# Patient Record
Sex: Female | Born: 1960 | Hispanic: Yes | Marital: Married | Smoking: Never smoker
Health system: Southern US, Community
[De-identification: ages and names within clinical notes are randomized; demographics above are authoritative.]

## PROBLEM LIST (undated history)

## (undated) DIAGNOSIS — R3915 Urgency of urination: Secondary | ICD-10-CM

## (undated) DIAGNOSIS — L659 Nonscarring hair loss, unspecified: Secondary | ICD-10-CM

## (undated) DIAGNOSIS — Z789 Other specified health status: Secondary | ICD-10-CM

## (undated) DIAGNOSIS — E119 Type 2 diabetes mellitus without complications: Secondary | ICD-10-CM

## (undated) DIAGNOSIS — E785 Hyperlipidemia, unspecified: Secondary | ICD-10-CM

## (undated) DIAGNOSIS — E669 Obesity, unspecified: Secondary | ICD-10-CM

## (undated) DIAGNOSIS — N950 Postmenopausal bleeding: Secondary | ICD-10-CM

## (undated) DIAGNOSIS — K219 Gastro-esophageal reflux disease without esophagitis: Secondary | ICD-10-CM

## (undated) DIAGNOSIS — N9489 Other specified conditions associated with female genital organs and menstrual cycle: Secondary | ICD-10-CM

## (undated) HISTORY — DX: Other specified health status: Z78.9

## (undated) HISTORY — DX: Type 2 diabetes mellitus without complications: E11.9

## (undated) HISTORY — PX: COSMETIC SURGERY: SHX468

## (undated) HISTORY — PX: ABLATION: SHX5711

## (undated) HISTORY — PX: BREAST BIOPSY: SHX20

## (undated) HISTORY — PX: OTHER SURGICAL HISTORY: SHX169

---

## 2020-07-25 DIAGNOSIS — S0121XA Laceration without foreign body of nose, initial encounter: Secondary | ICD-10-CM | POA: Diagnosis not present

## 2020-10-07 ENCOUNTER — Ambulatory Visit: Payer: BC Managed Care – PPO | Admitting: Obstetrics and Gynecology

## 2020-10-07 NOTE — Progress Notes (Signed)
PCP: Pcp, No   Chief Complaint  Patient presents with   Gynecologic Exam    Hot flashes, feels eyes dry all the time    HPI:      [name] is a 60 y.o. No obstetric history on file. whose LMP was No LMP recorded. Patient has had an ablation., presents today for her NP annual examination.  Her menses are absent due to ablation and then menopause. No VB for several yrs. No dysmen, pain. She does have vasomotor sx, declines tx.    Sex activity: single partner, contraception - post menopausal status. She does have vaginal dryness, improved with lubricants.  Last Pap:   > 3 yrs ago, hx of abn pap in distant past with normal repeat; no bx/tx. Hx of STDs: none  Last mammogram: ~3 yrs ago in [place]. There is a FH of breast cancer in her mom, genetic testing not indicated. There is no FH of ovarian cancer. The patient does do self-breast exams.  Colonoscopy: never  Tobacco use: The patient denies current or previous tobacco use. Alcohol use: none No drug use Exercise: moderately active  She does get adequate calcium but not Vitamin D in her diet.  No recent labs, hx of borderline hyperlipidemia in past.   Complains of dry eyes for about 3 yrs. Ophthalmologist recommended OTC eye drops that have been helpful. Pt has noticed dry throat for past 6 months. Drinks plenty of water. Hx of allergies when lived in [place], but not in [place] or here so far (moved here 3/22). No labs done for sjogrens.    Past Medical History:  Diagnosis Date   No pertinent past medical history     Past Surgical History:  Procedure Laterality Date   ABLATION     OTHER SURGICAL HISTORY     Bladder lifted, tummy tuck    Family History  Problem Relation Age of Onset   Breast cancer Mother 53    Social History   Socioeconomic History   Marital status: Married    Spouse name: Not on file   Number of children: Not on file   Years of education: Not on file   Highest education level: Not on file   Occupational History   Not on file  Tobacco Use   Smoking status: Never   Smokeless tobacco: Never  Vaping Use   Vaping Use: Never used  Substance and Sexual Activity   Alcohol use: Not Currently   Drug use: Never   Sexual activity: Yes    Birth control/protection: Surgical    Comment: Ablation  Other Topics Concern   Not on file  Social History Narrative   Not on file   Social Determinants of Health   Financial Resource Strain: Not on file  Food Insecurity: Not on file  Transportation Needs: Not on file  Physical Activity: Not on file  Stress: Not on file  Social Connections: Not on file  Intimate Partner Violence: Not on file    No current outpatient medications on file.     ROS:  Review of Systems  Constitutional:  Negative for fatigue, fever and unexpected weight change.  Respiratory:  Negative for cough, shortness of breath and wheezing.   Cardiovascular:  Negative for chest pain, palpitations and leg swelling.  Gastrointestinal:  Negative for blood in stool, constipation, diarrhea, nausea and vomiting.  Endocrine: Negative for cold intolerance, heat intolerance and polyuria.  Genitourinary:  Positive for dyspareunia. Negative for dysuria, flank pain, frequency, genital  sores, hematuria, menstrual problem, pelvic pain, urgency, vaginal bleeding, vaginal discharge and vaginal pain.  Musculoskeletal:  Negative for back pain, joint swelling and myalgias.  Skin:  Negative for rash.  Neurological:  Positive for dizziness. Negative for syncope, light-headedness, numbness and headaches.  Hematological:  Negative for adenopathy.  Psychiatric/Behavioral:  Negative for agitation, confusion, sleep disturbance and suicidal ideas. The patient is not nervous/anxious.   BREAST: No symptoms    Objective: BP 124/70   Ht 5\' 2"  (1.575 m)   Wt 181 lb (82.1 kg)   BMI 33.11 kg/m    Physical Exam Constitutional:      Appearance: She is well-developed.  Genitourinary:      Vulva normal.     Right Labia: No rash, tenderness or lesions.    Left Labia: No tenderness, lesions or rash.    No vaginal discharge, erythema or tenderness.      Right Adnexa: not tender and no mass present.    Left Adnexa: not tender and no mass present.    No cervical friability or polyp.     Uterus is not enlarged or tender.  Breasts:    Right: No mass, nipple discharge, skin change or tenderness.     Left: No mass, nipple discharge, skin change or tenderness.  Neck:     Thyroid: No thyromegaly.  Cardiovascular:     Rate and Rhythm: Normal rate and regular rhythm.     Heart sounds: Normal heart sounds. No murmur heard. Pulmonary:     Effort: Pulmonary effort is normal.     Breath sounds: Normal breath sounds.  Abdominal:     Palpations: Abdomen is soft.     Tenderness: There is no abdominal tenderness. There is no guarding or rebound.  Musculoskeletal:        General: Normal range of motion.     Cervical back: Normal range of motion.  Lymphadenopathy:     Cervical: No cervical adenopathy.  Neurological:     General: No focal deficit present.     Mental Status: She is alert and oriented to person, place, and time.     Cranial Nerves: No cranial nerve deficit.  Skin:    General: Skin is warm and dry.  Psychiatric:        Mood and Affect: Mood normal.        Behavior: Behavior normal.        Thought Content: Thought content normal.        Judgment: Judgment normal.  Vitals reviewed.    Assessment/Plan:  Encounter for annual routine gynecological examination  Cervical cancer screening - Plan: Cytology - PAP  Screening for HPV (human papillomavirus) - Plan: Cytology - PAP  Encounter for screening mammogram for malignant neoplasm of breast - Plan: MM 3D SCREEN BREAST BILATERAL; pt to sched mammo  Screening for colon cancer - Plan: Cologuard; colonoscopy/cologuard discussed. Pt elects cologuard. Ref sent. Will f/u with results.  Dry eyes - Plan: Sjogrens  syndrome-A extractable nuclear antibody, Sjogrens syndrome-B extractable nuclear antibody, CBC with Differential/Platelet, TSH; check labs. If neg, cont to use OTC eye drops per opthalmology  Dry mouth - Plan: Sjogrens syndrome-A extractable nuclear antibody, Sjogrens syndrome-B extractable nuclear antibody, CBC with Differential/Platelet; check labs.   Blood tests for routine general physical examination - Plan: Comprehensive metabolic panel, Lipid panel, Sjogrens syndrome-A extractable nuclear antibody, Sjogrens syndrome-B extractable nuclear antibody, CBC with Differential/Platelet  BMI 33.0-33.9,adult - Plan: Comprehensive metabolic panel  Screening cholesterol level - Plan: Lipid panel  H/O elevated lipids - Plan: Lipid panel  Thyroid disorder screening - Plan: TSH          GYN counsel breast self exam, mammography screening, menopause, adequate intake of calcium and vitamin D, diet and exercise    F/U  Return in about 1 year (around 10/08/2021).  Rebacca Votaw B. Zane Pellecchia, PA-C 10/08/2020 1:41 PM

## 2020-10-07 NOTE — Patient Instructions (Addendum)
I value your feedback and you entrusting us with your care. If you get a [facility] patient survey, I would appreciate you taking the time to let us know about your experience today. Thank you!  [facility]: [phone]  [facility]: [phone]    

## 2020-10-08 ENCOUNTER — Ambulatory Visit (INDEPENDENT_AMBULATORY_CARE_PROVIDER_SITE_OTHER): Payer: BC Managed Care – PPO | Admitting: Obstetrics and Gynecology

## 2020-10-08 ENCOUNTER — Other Ambulatory Visit (HOSPITAL_COMMUNITY)
Admission: RE | Admit: 2020-10-08 | Discharge: 2020-10-08 | Disposition: A | Discharge status: Home / Self Care | Payer: BC Managed Care – PPO | Source: Ambulatory Visit | Attending: Obstetrics and Gynecology | Admitting: Obstetrics and Gynecology

## 2020-10-08 ENCOUNTER — Other Ambulatory Visit: Payer: Self-pay

## 2020-10-08 ENCOUNTER — Encounter: Payer: Self-pay | Admitting: Obstetrics and Gynecology

## 2020-10-08 VITALS — BP 124/70 | Ht 62.0 in | Wt 181.0 lb

## 2020-10-08 DIAGNOSIS — H04123 Dry eye syndrome of bilateral lacrimal glands: Secondary | ICD-10-CM | POA: Insufficient documentation

## 2020-10-08 DIAGNOSIS — Z13220 Encounter for screening for lipoid disorders: Secondary | ICD-10-CM

## 2020-10-08 DIAGNOSIS — Z124 Encounter for screening for malignant neoplasm of cervix: Secondary | ICD-10-CM

## 2020-10-08 DIAGNOSIS — R682 Dry mouth, unspecified: Secondary | ICD-10-CM

## 2020-10-08 DIAGNOSIS — Z01419 Encounter for gynecological examination (general) (routine) without abnormal findings: Secondary | ICD-10-CM

## 2020-10-08 DIAGNOSIS — Z1151 Encounter for screening for human papillomavirus (HPV): Secondary | ICD-10-CM | POA: Insufficient documentation

## 2020-10-08 DIAGNOSIS — L659 Nonscarring hair loss, unspecified: Secondary | ICD-10-CM | POA: Insufficient documentation

## 2020-10-08 DIAGNOSIS — Z0000 Encounter for general adult medical examination without abnormal findings: Secondary | ICD-10-CM

## 2020-10-08 DIAGNOSIS — Z1231 Encounter for screening mammogram for malignant neoplasm of breast: Secondary | ICD-10-CM | POA: Diagnosis not present

## 2020-10-08 DIAGNOSIS — Z6833 Body mass index (BMI) 33.0-33.9, adult: Secondary | ICD-10-CM

## 2020-10-08 DIAGNOSIS — Z1329 Encounter for screening for other suspected endocrine disorder: Secondary | ICD-10-CM

## 2020-10-08 DIAGNOSIS — Z8639 Personal history of other endocrine, nutritional and metabolic disease: Secondary | ICD-10-CM

## 2020-10-08 DIAGNOSIS — Z1211 Encounter for screening for malignant neoplasm of colon: Secondary | ICD-10-CM

## 2020-10-09 ENCOUNTER — Other Ambulatory Visit: Payer: BC Managed Care – PPO

## 2020-10-09 DIAGNOSIS — Z0000 Encounter for general adult medical examination without abnormal findings: Secondary | ICD-10-CM | POA: Diagnosis not present

## 2020-10-09 DIAGNOSIS — R682 Dry mouth, unspecified: Secondary | ICD-10-CM

## 2020-10-09 DIAGNOSIS — Z8639 Personal history of other endocrine, nutritional and metabolic disease: Secondary | ICD-10-CM

## 2020-10-09 DIAGNOSIS — H04123 Dry eye syndrome of bilateral lacrimal glands: Secondary | ICD-10-CM | POA: Diagnosis not present

## 2020-10-09 DIAGNOSIS — Z13220 Encounter for screening for lipoid disorders: Secondary | ICD-10-CM

## 2020-10-09 DIAGNOSIS — Z1329 Encounter for screening for other suspected endocrine disorder: Secondary | ICD-10-CM | POA: Diagnosis not present

## 2020-10-09 DIAGNOSIS — Z6833 Body mass index (BMI) 33.0-33.9, adult: Secondary | ICD-10-CM

## 2020-10-10 LAB — LIPID PANEL
Chol/HDL Ratio: 4.1 ratio (ref 0.0–4.4)
Cholesterol, Total: 195 mg/dL (ref 100–199)
HDL: 48 mg/dL (ref >39)
LDL Chol Calc (NIH): 126 mg/dL — ABNORMAL HIGH (ref 0–99)
Triglycerides: 119 mg/dL (ref 0–149)
VLDL Cholesterol Cal: 21 mg/dL (ref 5–40)

## 2020-10-10 LAB — CBC WITH DIFFERENTIAL/PLATELET
Basophils Absolute: 0.1 10*3/uL (ref 0.0–0.2)
Basos: 1 %
EOS (ABSOLUTE): 0.1 10*3/uL (ref 0.0–0.4)
Eos: 1 %
Hematocrit: 42.6 % (ref 34.0–46.6)
Hemoglobin: 14.1 g/dL (ref 11.1–15.9)
Immature Grans (Abs): 0 10*3/uL (ref 0.0–0.1)
Immature Granulocytes: 0 %
Lymphocytes Absolute: 2.4 10*3/uL (ref 0.7–3.1)
Lymphs: 48 %
MCH: 28.3 pg (ref 26.6–33.0)
MCHC: 33.1 g/dL (ref 31.5–35.7)
MCV: 85 fL (ref 79–97)
Monocytes Absolute: 0.4 10*3/uL (ref 0.1–0.9)
Monocytes: 8 %
Neutrophils Absolute: 2.1 10*3/uL (ref 1.4–7.0)
Neutrophils: 42 %
Platelets: 275 10*3/uL (ref 150–450)
RBC: 4.99 x10E6/uL (ref 3.77–5.28)
RDW: 12.6 % (ref 11.7–15.4)
WBC: 5.1 10*3/uL (ref 3.4–10.8)

## 2020-10-10 LAB — COMPREHENSIVE METABOLIC PANEL
ALT: 27 IU/L (ref 0–32)
AST: 18 IU/L (ref 0–40)
Albumin/Globulin Ratio: 1.5 (ref 1.2–2.2)
Albumin: 4 g/dL (ref 3.8–4.9)
Alkaline Phosphatase: 86 IU/L (ref 44–121)
BUN/Creatinine Ratio: 17 (ref 9–23)
BUN: 13 mg/dL (ref 6–24)
Bilirubin Total: 0.9 mg/dL (ref 0.0–1.2)
CO2: 22 mmol/L (ref 20–29)
Calcium: 9 mg/dL (ref 8.7–10.2)
Chloride: 104 mmol/L (ref 96–106)
Creatinine, Ser: 0.77 mg/dL (ref 0.57–1.00)
Globulin, Total: 2.6 g/dL (ref 1.5–4.5)
Glucose: 120 mg/dL — ABNORMAL HIGH (ref 65–99)
Potassium: 4.2 mmol/L (ref 3.5–5.2)
Sodium: 140 mmol/L (ref 134–144)
Total Protein: 6.6 g/dL (ref 6.0–8.5)
eGFR: 89 mL/min/{1.73_m2} (ref >59)

## 2020-10-10 LAB — SJOGRENS SYNDROME-B EXTRACTABLE NUCLEAR ANTIBODY: ENA SSB (LA) Ab: <0.2 AI (ref 0.0–0.9)

## 2020-10-10 LAB — SJOGRENS SYNDROME-A EXTRACTABLE NUCLEAR ANTIBODY: ENA SSA (RO) Ab: <0.2 AI (ref 0.0–0.9)

## 2020-10-10 LAB — TSH: TSH: 1.76 u[IU]/mL (ref 0.450–4.500)

## 2020-10-13 LAB — CYTOLOGY - PAP
Comment: NEGATIVE
Diagnosis: NEGATIVE
Diagnosis: REACTIVE
High risk HPV: NEGATIVE

## 2020-10-15 LAB — SPECIMEN STATUS REPORT

## 2020-10-15 LAB — HGB A1C W/O EAG: Hgb A1c MFr Bld: 6.5 % — ABNORMAL HIGH (ref 4.8–5.6)

## 2020-10-22 ENCOUNTER — Encounter: Payer: Self-pay | Admitting: Obstetrics and Gynecology

## 2020-10-22 DIAGNOSIS — E119 Type 2 diabetes mellitus without complications: Secondary | ICD-10-CM

## 2020-10-22 MED ORDER — METFORMIN HCL 500 MG PO TABS: 500.0000 mg | ORAL_TABLET | Freq: Every day | ORAL | 0 refills | Status: DC

## 2020-10-29 ENCOUNTER — Ambulatory Visit
Admission: RE | Admit: 2020-10-29 | Discharge: 2020-10-29 | Disposition: A | Discharge status: Home / Self Care | Payer: BC Managed Care – PPO | Source: Ambulatory Visit | Attending: Obstetrics and Gynecology | Admitting: Obstetrics and Gynecology

## 2020-10-29 ENCOUNTER — Other Ambulatory Visit: Payer: Self-pay

## 2020-10-29 DIAGNOSIS — Z1231 Encounter for screening mammogram for malignant neoplasm of breast: Secondary | ICD-10-CM | POA: Diagnosis not present

## 2020-11-17 ENCOUNTER — Other Ambulatory Visit: Payer: Self-pay | Admitting: Obstetrics and Gynecology

## 2020-11-17 DIAGNOSIS — R928 Other abnormal and inconclusive findings on diagnostic imaging of breast: Secondary | ICD-10-CM

## 2020-11-17 DIAGNOSIS — N6320 Unspecified lump in the left breast, unspecified quadrant: Secondary | ICD-10-CM

## 2020-11-18 ENCOUNTER — Other Ambulatory Visit: Payer: Self-pay | Admitting: Internal Medicine

## 2020-11-23 ENCOUNTER — Ambulatory Visit
Admission: RE | Admit: 2020-11-23 | Discharge: 2020-11-23 | Disposition: A | Discharge status: Home / Self Care | Payer: BC Managed Care – PPO | Source: Ambulatory Visit | Attending: Obstetrics and Gynecology | Admitting: Obstetrics and Gynecology

## 2020-11-23 ENCOUNTER — Other Ambulatory Visit: Payer: Self-pay

## 2020-11-23 DIAGNOSIS — R928 Other abnormal and inconclusive findings on diagnostic imaging of breast: Secondary | ICD-10-CM

## 2020-11-23 DIAGNOSIS — N6320 Unspecified lump in the left breast, unspecified quadrant: Secondary | ICD-10-CM

## 2020-11-23 DIAGNOSIS — R922 Inconclusive mammogram: Secondary | ICD-10-CM | POA: Diagnosis not present

## 2020-11-23 DIAGNOSIS — N6002 Solitary cyst of left breast: Secondary | ICD-10-CM | POA: Diagnosis not present

## 2020-12-11 DIAGNOSIS — Z20822 Contact with and (suspected) exposure to covid-19: Secondary | ICD-10-CM | POA: Diagnosis not present

## 2020-12-14 ENCOUNTER — Telehealth: Payer: Self-pay

## 2020-12-14 NOTE — Telephone Encounter (Signed)
Called pt to f/u on Cologuard test, is she still planning to complete. Pt says she's been thinking about it, hasn't had the courage.

## 2021-03-14 DIAGNOSIS — J019 Acute sinusitis, unspecified: Secondary | ICD-10-CM | POA: Diagnosis not present

## 2021-03-14 DIAGNOSIS — H6593 Unspecified nonsuppurative otitis media, bilateral: Secondary | ICD-10-CM | POA: Diagnosis not present

## 2021-06-02 DIAGNOSIS — Z1211 Encounter for screening for malignant neoplasm of colon: Secondary | ICD-10-CM

## 2021-06-02 HISTORY — DX: Encounter for screening for malignant neoplasm of colon: Z12.11

## 2021-06-11 DIAGNOSIS — Z1211 Encounter for screening for malignant neoplasm of colon: Secondary | ICD-10-CM | POA: Diagnosis not present

## 2021-06-18 LAB — COLOGUARD: COLOGUARD: NEGATIVE

## 2021-06-21 ENCOUNTER — Encounter: Payer: Self-pay | Admitting: Obstetrics and Gynecology

## 2021-06-21 ENCOUNTER — Telehealth: Payer: Self-pay | Admitting: Obstetrics and Gynecology

## 2021-06-21 NOTE — Telephone Encounter (Signed)
Pt aware of neg Cologuard results, repeat in 3 yrs.

## 2021-06-29 ENCOUNTER — Ambulatory Visit
Admission: RE | Admit: 2021-06-29 | Discharge: 2021-06-29 | Disposition: A | Discharge status: Home / Self Care | Payer: BC Managed Care – PPO | Source: Ambulatory Visit | Attending: Obstetrics and Gynecology | Admitting: Obstetrics and Gynecology

## 2021-06-29 ENCOUNTER — Encounter: Payer: Self-pay | Admitting: Obstetrics and Gynecology

## 2021-06-29 ENCOUNTER — Other Ambulatory Visit: Payer: Self-pay

## 2021-06-29 DIAGNOSIS — N6320 Unspecified lump in the left breast, unspecified quadrant: Secondary | ICD-10-CM | POA: Insufficient documentation

## 2021-06-29 DIAGNOSIS — R928 Other abnormal and inconclusive findings on diagnostic imaging of breast: Secondary | ICD-10-CM | POA: Insufficient documentation

## 2021-06-29 DIAGNOSIS — R922 Inconclusive mammogram: Secondary | ICD-10-CM | POA: Diagnosis not present

## 2021-07-14 LAB — COLOGUARD: Cologuard: NEGATIVE

## 2021-07-22 ENCOUNTER — Ambulatory Visit: Payer: BC Managed Care – PPO | Admitting: Adult Health

## 2021-08-11 ENCOUNTER — Ambulatory Visit: Payer: BC Managed Care – PPO | Admitting: Internal Medicine

## 2021-12-13 ENCOUNTER — Encounter: Payer: Self-pay | Admitting: Nurse Practitioner

## 2021-12-13 ENCOUNTER — Ambulatory Visit: Payer: BC Managed Care – PPO | Admitting: Nurse Practitioner

## 2021-12-13 ENCOUNTER — Other Ambulatory Visit: Payer: Self-pay

## 2021-12-13 VITALS — BP 124/72 | HR 96 | Temp 98.0°F | Resp 18 | Ht 62.0 in | Wt 183.3 lb

## 2021-12-13 DIAGNOSIS — Z114 Encounter for screening for human immunodeficiency virus [HIV]: Secondary | ICD-10-CM

## 2021-12-13 DIAGNOSIS — Z7689 Persons encountering health services in other specified circumstances: Secondary | ICD-10-CM

## 2021-12-13 DIAGNOSIS — L659 Nonscarring hair loss, unspecified: Secondary | ICD-10-CM

## 2021-12-13 DIAGNOSIS — E1165 Type 2 diabetes mellitus with hyperglycemia: Secondary | ICD-10-CM | POA: Diagnosis not present

## 2021-12-13 DIAGNOSIS — G8929 Other chronic pain: Secondary | ICD-10-CM

## 2021-12-13 DIAGNOSIS — Z1231 Encounter for screening mammogram for malignant neoplasm of breast: Secondary | ICD-10-CM

## 2021-12-13 DIAGNOSIS — E669 Obesity, unspecified: Secondary | ICD-10-CM | POA: Insufficient documentation

## 2021-12-13 DIAGNOSIS — Z1159 Encounter for screening for other viral diseases: Secondary | ICD-10-CM | POA: Diagnosis not present

## 2021-12-13 DIAGNOSIS — E785 Hyperlipidemia, unspecified: Secondary | ICD-10-CM | POA: Diagnosis not present

## 2021-12-13 DIAGNOSIS — Z130 Encounter for screening for diseases of the blood and blood-forming organs and certain disorders involving the immune mechanism: Secondary | ICD-10-CM

## 2021-12-13 DIAGNOSIS — Z113 Encounter for screening for infections with a predominantly sexual mode of transmission: Secondary | ICD-10-CM | POA: Diagnosis not present

## 2021-12-13 DIAGNOSIS — H04123 Dry eye syndrome of bilateral lacrimal glands: Secondary | ICD-10-CM

## 2021-12-13 DIAGNOSIS — R1012 Left upper quadrant pain: Secondary | ICD-10-CM

## 2021-12-13 DIAGNOSIS — R682 Dry mouth, unspecified: Secondary | ICD-10-CM

## 2021-12-13 NOTE — Assessment & Plan Note (Signed)
Getting labs.  Patient reports she has a dry mouth even with drinking water.

## 2021-12-13 NOTE — Assessment & Plan Note (Signed)
Last LDL was 126.  Discussed decreasing foods high in saturated fats.  We will get labs today.

## 2021-12-13 NOTE — Assessment & Plan Note (Signed)
Patient not currently on any medication.  Her last A1c was 6.5 on 10/09/2020.  Patient does report polydipsia.  We will get labs today.

## 2021-12-13 NOTE — Progress Notes (Signed)
BP 124/72   Pulse 96   Temp 98 F (36.7 C) (Oral)   Resp 18   Ht 5\' 2"  (1.575 m)   Wt 183 lb 4.8 oz (83.1 kg)   SpO2 99%   BMI 33.53 kg/m    Subjective:    Patient ID: , female    DOB: [birth year], 61 y.o.   MRN: [id number]  HPI: Emma Richards is a 61 y.o. female  Chief Complaint  Patient presents with   Establish Care   Establish care: She says her last physical was unsure.  Her last Pap was 10/08/2020.  Her last mammogram was 06/29/2021 for diagnostic left breast, it was recommended that she start annual screening on 10/19/2021.  Her last colon cancer screening was cologuard on 06/11/2021.  Diabetes: She was prescribed metformin but unable to tolerate it.  Her last A1c was 6.5 on 10/09/2020.  She is due for foot exam, eye exam and urine microalbumin.  She denies any polyuria, or polyphasia. She says that she has had some polydipsia. We will get labs today.  Hyperlipidemia: Last LDL was 126 on 10/09/2020.  She is not currently on any cholesterol-lowering medication. Discussed decreasing foods high in saturated fats. We will get labs today. The 10-year ASCVD risk score (Arnett DK, et al., 2019) is: 6.4%   Values used to calculate the score:     Age: 56 years     Sex: Female     Is Non-Hispanic African American: No     Diabetic: Yes     Tobacco smoker: No     Systolic Blood Pressure: 124 mmHg     Is BP treated: No     HDL Cholesterol: 48 mg/dL     Total Cholesterol: 195 mg/dL   Dry eye/Dry mouth/ hair loss: She says she has been for a few years.  Had eye exam which was normal.  Patient states that she has been using eye drops.  Will get labs.   LUQ abdominal pain:Patient reports that she has had this pain off and on for years.  Patient states about 3 years ago she was seen in the emergency department because the pain got really bad.  Patient states they did testing and told her everything was normal.  Patient describes the pain as a sharp twinge.   Patient states it can last half a day to up to 3 days.  Patient denies any fever, chest pain, shortness of breath, changes in bowels, constipation or urinary complaints.  We will get lab work.  Discussed with patient this could be associated with musculoskeletal recommend that if it happens again to take an ibuprofen.  Patient is agreement with plan.  Obesity: His current weight is 183 pounds with a BMI of 33.53.  Encourage lifestyle modification by increasing physical activity and watching portion control.  Patient states that she does feel better when she is following a lower carb diet.  Relevant past medical, surgical, family and social history reviewed and updated as indicated. Interim medical history since our last visit reviewed. Allergies and medications reviewed and updated.  Review of Systems  Constitutional: Negative for fever or weight change.  Respiratory: Negative for cough and shortness of breath.   Cardiovascular: Negative for chest pain or palpitations.  Gastrointestinal: Positive for abdominal pain, no bowel changes.  Musculoskeletal: Negative for gait problem or joint swelling.  Skin: Negative for rash.  Neurological: Negative for dizziness or headache.  No other specific complaints in a complete  review of systems (except as listed in HPI above).      Objective:    BP 124/72   Pulse 96   Temp 98 F (36.7 C) (Oral)   Resp 18   Ht 5\' 2"  (1.575 m)   Wt 183 lb 4.8 oz (83.1 kg)   SpO2 99%   BMI 33.53 kg/m   Wt Readings from Last 3 Encounters:  12/13/21 183 lb 4.8 oz (83.1 kg)  10/08/20 181 lb (82.1 kg)    Physical Exam  Constitutional: Patient appears well-developed and well-nourished. Obese  No distress.  HEENT: head atraumatic, normocephalic, pupils equal and reactive to light, neck supple Cardiovascular: Normal rate, regular rhythm and normal heart sounds.  No murmur heard. No BLE edema. Pulmonary/Chest: Effort normal and breath sounds normal. No respiratory  distress. Abdominal: Soft.  There is no tenderness. Psychiatric: Patient has a normal mood and affect. behavior is normal. Judgment and thought content normal.  Results for orders placed or performed in visit on 07/14/21  Cologuard  Result Value Ref Range   Cologuard Negative Negative      Assessment & Plan:   Problem List Items Addressed This Visit       Digestive   Dry mouth    Getting labs.  Patient reports she has a dry mouth even with drinking water.      Relevant Orders   TSH     Endocrine   Type 2 diabetes mellitus with hyperglycemia, without long-term current use of insulin (HCC) - Primary    Patient not currently on any medication.  Her last A1c was 6.5 on 10/09/2020.  Patient does report polydipsia.  We will get labs today.      Relevant Orders   COMPLETE METABOLIC PANEL WITH GFR   Hemoglobin A1c   Microalbumin / creatinine urine ratio     Other   Dry eyes    Patient complaining of dry eyes.  Patient states she has been using eyedrops.  Patient has seen eye doctor with no cause for dry eyes.  We will get labs.      Relevant Orders   TSH   Encounter to establish care   Hyperlipidemia    Last LDL was 126.  Discussed decreasing foods high in saturated fats.  We will get labs today.      Relevant Orders   Lipid panel   Obesity (BMI 30-39.9)    Encouraged lifestyle modification including increasing physical activity to 30 minutes a day watching portion sizes.      Hair loss    Patient reports she has had some hair loss.  We will get labs.      Relevant Orders   TSH   Other Visit Diagnoses     Screening mammogram for breast cancer       Relevant Orders   MM 3D SCREEN BREAST BILATERAL   Encounter for hepatitis C screening test for low risk patient       Relevant Orders   Hepatitis C antibody   Encounter for screening for HIV       Relevant Orders   HIV Antibody (routine testing w rflx)   Routine screening for STI (sexually transmitted infection)        Relevant Orders   HIV Antibody (routine testing w rflx)   Hepatitis C antibody   RPR   Screening for deficiency anemia       Relevant Orders   CBC with Differential/Platelet   Chronic LUQ pain  Relevant Orders   COMPLETE METABOLIC PANEL WITH GFR   Lipase   Amylase        Follow up plan: Return in about 6 months (around 06/15/2022) for follow up.

## 2021-12-13 NOTE — Assessment & Plan Note (Signed)
Patient complaining of dry eyes.  Patient states she has been using eyedrops.  Patient has seen eye doctor with no cause for dry eyes.  We will get labs.

## 2021-12-13 NOTE — Assessment & Plan Note (Signed)
Patient reports she has had some hair loss.  We will get labs.

## 2021-12-13 NOTE — Assessment & Plan Note (Signed)
Encouraged lifestyle modification including increasing physical activity to 30 minutes a day watching portion sizes.

## 2021-12-14 LAB — CBC WITH DIFFERENTIAL/PLATELET
Absolute Monocytes: 377 cells/uL (ref 200–950)
Basophils Absolute: 59 cells/uL (ref 0–200)
Basophils Relative: 1.2 %
Eosinophils Absolute: 88 cells/uL (ref 15–500)
Eosinophils Relative: 1.8 %
HCT: 42.2 % (ref 35.0–45.0)
Hemoglobin: 14.4 g/dL (ref 11.7–15.5)
Lymphs Abs: 2254 cells/uL (ref 850–3900)
MCH: 29 pg (ref 27.0–33.0)
MCHC: 34.1 g/dL (ref 32.0–36.0)
MCV: 84.9 fL (ref 80.0–100.0)
MPV: 11.2 fL (ref 7.5–12.5)
Monocytes Relative: 7.7 %
Neutro Abs: 2122 cells/uL (ref 1500–7800)
Neutrophils Relative %: 43.3 %
Platelets: 279 10*3/uL (ref 140–400)
RBC: 4.97 10*6/uL (ref 3.80–5.10)
RDW: 12.6 % (ref 11.0–15.0)
Total Lymphocyte: 46 %
WBC: 4.9 10*3/uL (ref 3.8–10.8)

## 2021-12-14 LAB — LIPID PANEL
Cholesterol: 203 mg/dL — ABNORMAL HIGH (ref <200)
HDL: 57 mg/dL (ref >=50)
LDL Cholesterol (Calc): 113 mg/dL (calc) — ABNORMAL HIGH
Non-HDL Cholesterol (Calc): 146 mg/dL (calc) — ABNORMAL HIGH (ref <130)
Total CHOL/HDL Ratio: 3.6 (calc) (ref <5.0)
Triglycerides: 209 mg/dL — ABNORMAL HIGH (ref <150)

## 2021-12-14 LAB — COMPLETE METABOLIC PANEL WITH GFR
AG Ratio: 1.5 (calc) (ref 1.0–2.5)
ALT: 25 U/L (ref 6–29)
AST: 21 U/L (ref 10–35)
Albumin: 4.3 g/dL (ref 3.6–5.1)
Alkaline phosphatase (APISO): 76 U/L (ref 37–153)
BUN: 16 mg/dL (ref 7–25)
CO2: 24 mmol/L (ref 20–32)
Calcium: 9.4 mg/dL (ref 8.6–10.4)
Chloride: 109 mmol/L (ref 98–110)
Creat: 0.77 mg/dL (ref 0.50–1.05)
Globulin: 2.8 g/dL (calc) (ref 1.9–3.7)
Glucose, Bld: 91 mg/dL (ref 65–99)
Potassium: 4.3 mmol/L (ref 3.5–5.3)
Sodium: 144 mmol/L (ref 135–146)
Total Bilirubin: 1.3 mg/dL — ABNORMAL HIGH (ref 0.2–1.2)
Total Protein: 7.1 g/dL (ref 6.1–8.1)
eGFR: 88 mL/min/{1.73_m2} (ref >=60)

## 2021-12-14 LAB — HIV ANTIBODY (ROUTINE TESTING W REFLEX): HIV 1&2 Ab, 4th Generation: NONREACTIVE

## 2021-12-14 LAB — TSH: TSH: 1.3 mIU/L (ref 0.40–4.50)

## 2021-12-14 LAB — HEMOGLOBIN A1C
Hgb A1c MFr Bld: 6.1 % of total Hgb — ABNORMAL HIGH (ref <5.7)
Mean Plasma Glucose: 128 mg/dL
eAG (mmol/L): 7.1 mmol/L

## 2021-12-14 LAB — RPR: RPR Ser Ql: NONREACTIVE

## 2021-12-14 LAB — LIPASE: Lipase: 37 U/L (ref 7–60)

## 2021-12-14 LAB — MICROALBUMIN / CREATININE URINE RATIO
Creatinine, Urine: 208 mg/dL (ref 20–275)
Microalb Creat Ratio: 4 mcg/mg creat (ref <30)
Microalb, Ur: 0.8 mg/dL

## 2021-12-14 LAB — AMYLASE: Amylase: 45 U/L (ref 21–101)

## 2021-12-14 LAB — HEPATITIS C ANTIBODY: Hepatitis C Ab: NONREACTIVE

## 2021-12-24 ENCOUNTER — Ambulatory Visit: Payer: Self-pay | Admitting: Nurse Practitioner

## 2022-01-07 ENCOUNTER — Other Ambulatory Visit: Payer: Self-pay | Admitting: Internal Medicine

## 2022-07-21 ENCOUNTER — Ambulatory Visit
Admission: RE | Admit: 2022-07-21 | Discharge: 2022-07-21 | Disposition: A | Discharge status: Home / Self Care | Payer: BC Managed Care – PPO | Source: Ambulatory Visit | Attending: Nurse Practitioner | Admitting: Nurse Practitioner

## 2022-07-21 VITALS — BP 151/95 | HR 85 | Temp 98.1°F | Resp 16

## 2022-07-21 DIAGNOSIS — H9202 Otalgia, left ear: Secondary | ICD-10-CM

## 2022-07-21 NOTE — Discharge Instructions (Signed)
Follow up here or with your primary care provider if your symptoms are worsening or not improving.     

## 2022-07-21 NOTE — ED Triage Notes (Signed)
Patient presents to [facility] for left ear pain. Not using any OTC meds.

## 2022-07-21 NOTE — ED Provider Notes (Signed)
Rod[facility] Palau    CSN: [id number] Arrival date & time: 07/21/22  0845      History   Chief Complaint Chief Complaint  Patient presents with   Ear Fullness    Pain in ear - Entered by patient    HPI Layanne Edmon is a 62 y.o. female.    Ear Fullness    Presents to urgent care with complaint of left ear pain. She states she is not using any OTC medication to relieve her symptoms.  PMH significant for DM 2, most recently well-controlled.  Denies recent URI or sinus pain/pressure.  Past Medical History:  Diagnosis Date   Screening for colon cancer 06/2021   NEG cologuard, repeat after 3 yrs   Type 2 diabetes mellitus [facility])     Patient Active Problem List   Diagnosis Date Noted   Type 2 diabetes mellitus with hyperglycemia, without long-term current use of insulin ([facility]) 12/13/2021   Encounter to establish care 12/13/2021   Hyperlipidemia 12/13/2021   Obesity (BMI 30-39.9) 12/13/2021   Dry eyes 10/08/2020   Dry mouth 10/08/2020   Hair loss 10/08/2020    Past Surgical History:  Procedure Laterality Date   ABLATION     BREAST BIOPSY Left    benign   OTHER SURGICAL HISTORY     Bladder lifted, tummy tuck    OB History     Gravida  3   Para  3   Term  3   Preterm      AB      Living  3      SAB      IAB      Ectopic      Multiple      Live Births  3            Home Medications    Prior to Admission medications   Not on File    Family History Family History  Problem Relation Age of Onset   Graves' disease Mother    Cancer Mother    Breast cancer Mother 13   Graves' disease Sister    Graves' disease Brother    Diabetes Maternal Grandfather     Social History Social History   Tobacco Use   Smoking status: Never   Smokeless tobacco: Never  Vaping Use   Vaping Use: Never used  Substance Use Topics   Alcohol use: Not Currently   Drug use: Never     Allergies   Patient has no known  allergies.   Review of Systems Review of Systems   Physical Exam Triage Vital Signs ED Triage Vitals [07/21/22 0856]  Enc Vitals Group     BP (!) 170/89     Pulse Rate 85     Resp 16     Temp 98.1 F (36.7 C)     Temp Source Temporal     SpO2 97 %     Weight      Height      Head Circumference      Peak Flow      Pain Score      Pain Loc      Pain Edu?      Excl. in [place]?    No data found.  Updated Vital Signs BP (!) 151/95 (BP Location: Left Arm)   Pulse 85   Temp 98.1 F (36.7 C) (Temporal)   Resp 16   SpO2 97%   Visual Acuity Right Eye Distance:  Left Eye Distance:   Bilateral Distance:    Right Eye Near:   Left Eye Near:    Bilateral Near:     Physical Exam Vitals reviewed.  Constitutional:      Appearance: Normal appearance.  HENT:     Left Ear: No tenderness. A middle ear effusion is present. Tympanic membrane is not erythematous.  Skin:    General: Skin is warm and dry.  Neurological:     General: No focal deficit present.     Mental Status: She is alert and oriented to person, place, and time.  Psychiatric:        Mood and Affect: Mood normal.        Behavior: Behavior normal.      UC Treatments / Results  Labs (all labs ordered are listed, but only abnormal results are displayed) Labs Reviewed - No data to display  EKG   Radiology No results found.  Procedures Procedures (including critical care time)  Medications Ordered in UC Medications - No data to display  Initial Impression / Assessment and Plan / UC Course  I have reviewed the triage vital signs and the nursing notes.  Pertinent labs & imaging results that were available during my care of the patient were reviewed by me and considered in my medical decision making (see chart for details).   Patient is afebrile here without recent antipyretics. Satting well on room air. Overall is well appearing, well hydrated, without respiratory distress.  Left TM is not  erythematous.  There is middle ear effusion.  No evidence of suppurative AOM.  Suspect left eustachian tube dysfunction.  Unknown etiology.  Agreed to watch and wait.  Final Clinical Impressions(s) / UC Diagnoses   Final diagnoses:  None   Discharge Instructions   None    ED Prescriptions   None    PDMP not reviewed this encounter.   Rose Phi, [place] 07/21/22 [phone]

## 2022-08-30 ENCOUNTER — Ambulatory Visit: Payer: BC Managed Care – PPO | Admitting: Obstetrics and Gynecology

## 2022-10-16 NOTE — Progress Notes (Unsigned)
PCP: Berniece Salines, FNP   No chief complaint on file.   HPI:      [name] is a 62 y.o. No obstetric history on file. whose LMP was No LMP recorded. Patient has had an ablation., presents today for her annual examination.  Her menses are absent due to ablation and then menopause. No VB for several yrs. No dysmen, pain. She does have vasomotor sx, declines tx.    Sex activity: single partner, contraception - post menopausal status. She does have vaginal dryness, improved with lubricants.  Last Pap: 10/08/20 Results were no abnormalities/neg HPV DNA; hx of abn pap in distant past with normal repeat; no bx/tx. Hx of STDs: none  Last mammogram: 06/29/21  Results were normal, repeat in 12 months There is a FH of breast cancer in her mom, genetic testing not indicated. There is no FH of ovarian cancer. The patient does do self-breast exams.  Colonoscopy: never; NEG Cologuard 2/23, repeat in 3 yrs  Tobacco use: The patient denies current or previous tobacco use. Alcohol use: none No drug use Exercise: moderately active  She does get adequate calcium but not Vitamin D in her diet.  No recent labs, hx of borderline hyperlipidemia in past.   Complains of dry eyes for about 3 yrs. Ophthalmologist recommended OTC eye drops that have been helpful. Pt has noticed dry throat for past 6 months. Drinks plenty of water. Hx of allergies when lived in [place], but not in [place] or here so far (moved here 3/22). No labs done for sjogrens.    Past Medical History:  Diagnosis Date   Screening for colon cancer 06/2021   NEG cologuard, repeat after 3 yrs   Type 2 diabetes mellitus (HCC)     Past Surgical History:  Procedure Laterality Date   ABLATION     BREAST BIOPSY Left    benign   OTHER SURGICAL HISTORY     Bladder lifted, tummy tuck    Family History  Problem Relation Age of Onset   Graves' disease Mother    Cancer Mother    Breast cancer Mother 42   Graves' disease Sister     Graves' disease Brother    Diabetes Maternal Grandfather     Social History   Socioeconomic History   Marital status: Married    Spouse name: Not on file   Number of children: 4   Years of education: Not on file   Highest education level: Not on file  Occupational History   Not on file  Tobacco Use   Smoking status: Never   Smokeless tobacco: Never  Vaping Use   Vaping Use: Never used  Substance and Sexual Activity   Alcohol use: Not Currently   Drug use: Never   Sexual activity: Yes    Birth control/protection: Surgical    Comment: Ablation  Other Topics Concern   Not on file  Social History Narrative   Not on file   Social Determinants of Health   Financial Resource Strain: Not on file  Food Insecurity: Not on file  Transportation Needs: Not on file  Physical Activity: Not on file  Stress: Not on file  Social Connections: Not on file  Intimate Partner Violence: Not on file    No current outpatient medications on file.     ROS:  Review of Systems  Constitutional:  Negative for fatigue, fever and unexpected weight change.  Respiratory:  Negative for cough, shortness of breath and wheezing.  Cardiovascular:  Negative for chest pain, palpitations and leg swelling.  Gastrointestinal:  Negative for blood in stool, constipation, diarrhea, nausea and vomiting.  Endocrine: Negative for cold intolerance, heat intolerance and polyuria.  Genitourinary:  Positive for dyspareunia. Negative for dysuria, flank pain, frequency, genital sores, hematuria, menstrual problem, pelvic pain, urgency, vaginal bleeding, vaginal discharge and vaginal pain.  Musculoskeletal:  Negative for back pain, joint swelling and myalgias.  Skin:  Negative for rash.  Neurological:  Positive for dizziness. Negative for syncope, light-headedness, numbness and headaches.  Hematological:  Negative for adenopathy.  Psychiatric/Behavioral:  Negative for agitation, confusion, sleep disturbance and  suicidal ideas. The patient is not nervous/anxious.    BREAST: No symptoms    Objective: There were no vitals taken for this visit.   Physical Exam Constitutional:      Appearance: She is well-developed.  Genitourinary:     Vulva normal.     Right Labia: No rash, tenderness or lesions.    Left Labia: No tenderness, lesions or rash.    No vaginal discharge, erythema or tenderness.      Right Adnexa: not tender and no mass present.    Left Adnexa: not tender and no mass present.    No cervical friability or polyp.     Uterus is not enlarged or tender.  Breasts:    Right: No mass, nipple discharge, skin change or tenderness.     Left: No mass, nipple discharge, skin change or tenderness.  Neck:     Thyroid: No thyromegaly.  Cardiovascular:     Rate and Rhythm: Normal rate and regular rhythm.     Heart sounds: Normal heart sounds. No murmur heard. Pulmonary:     Effort: Pulmonary effort is normal.     Breath sounds: Normal breath sounds.  Abdominal:     Palpations: Abdomen is soft.     Tenderness: There is no abdominal tenderness. There is no guarding or rebound.  Musculoskeletal:        General: Normal range of motion.     Cervical back: Normal range of motion.  Lymphadenopathy:     Cervical: No cervical adenopathy.  Neurological:     General: No focal deficit present.     Mental Status: She is alert and oriented to person, place, and time.     Cranial Nerves: No cranial nerve deficit.  Skin:    General: Skin is warm and dry.  Psychiatric:        Mood and Affect: Mood normal.        Behavior: Behavior normal.        Thought Content: Thought content normal.        Judgment: Judgment normal.  Vitals reviewed.     Assessment/Plan:  Encounter for annual routine gynecological examination  Cervical cancer screening - Plan: Cytology - PAP  Screening for HPV (human papillomavirus) - Plan: Cytology - PAP  Encounter for screening mammogram for malignant neoplasm of  breast - Plan: MM 3D SCREEN BREAST BILATERAL; pt to sched mammo  Screening for colon cancer - Plan: Cologuard; colonoscopy/cologuard discussed. Pt elects cologuard. Ref sent. Will f/u with results.  Dry eyes - Plan: Sjogrens syndrome-A extractable nuclear antibody, Sjogrens syndrome-B extractable nuclear antibody, CBC with Differential/Platelet, TSH; check labs. If neg, cont to use OTC eye drops per opthalmology  Dry mouth - Plan: Sjogrens syndrome-A extractable nuclear antibody, Sjogrens syndrome-B extractable nuclear antibody, CBC with Differential/Platelet; check labs.   Blood tests for routine general physical examination - Plan: Comprehensive  metabolic panel, Lipid panel, Sjogrens syndrome-A extractable nuclear antibody, Sjogrens syndrome-B extractable nuclear antibody, CBC with Differential/Platelet  BMI 33.0-33.9,adult - Plan: Comprehensive metabolic panel  Screening cholesterol level - Plan: Lipid panel  H/O elevated lipids - Plan: Lipid panel  Thyroid disorder screening - Plan: TSH          GYN counsel breast self exam, mammography screening, menopause, adequate intake of calcium and vitamin D, diet and exercise    F/U  No follow-ups on file.  Rakeb Kibble B. Halen Mossbarger, PA-C 10/16/2022 9:53 AM

## 2022-10-17 ENCOUNTER — Ambulatory Visit (INDEPENDENT_AMBULATORY_CARE_PROVIDER_SITE_OTHER): Payer: BC Managed Care – PPO | Admitting: Obstetrics and Gynecology

## 2022-10-17 ENCOUNTER — Other Ambulatory Visit (HOSPITAL_COMMUNITY)
Admission: RE | Admit: 2022-10-17 | Discharge: 2022-10-17 | Disposition: A | Discharge status: Home / Self Care | Payer: BC Managed Care – PPO | Source: Ambulatory Visit | Attending: Obstetrics and Gynecology | Admitting: Obstetrics and Gynecology

## 2022-10-17 ENCOUNTER — Encounter: Payer: Self-pay | Admitting: Obstetrics and Gynecology

## 2022-10-17 VITALS — BP 120/80 | Ht 62.0 in | Wt 182.0 lb

## 2022-10-17 DIAGNOSIS — N841 Polyp of cervix uteri: Secondary | ICD-10-CM

## 2022-10-17 DIAGNOSIS — Z01419 Encounter for gynecological examination (general) (routine) without abnormal findings: Secondary | ICD-10-CM

## 2022-10-17 DIAGNOSIS — Z1231 Encounter for screening mammogram for malignant neoplasm of breast: Secondary | ICD-10-CM

## 2022-10-17 NOTE — Patient Instructions (Signed)
I value your feedback and you entrusting us with your care. If you get a [facility] patient survey, I would appreciate you taking the time to let us know about your experience today. Thank you!  [facility] ([place])--[phone]  

## 2022-10-18 LAB — SURGICAL PATHOLOGY

## 2022-10-31 ENCOUNTER — Encounter: Payer: Self-pay | Admitting: Obstetrics and Gynecology

## 2022-10-31 DIAGNOSIS — N950 Postmenopausal bleeding: Secondary | ICD-10-CM

## 2022-10-31 NOTE — Telephone Encounter (Signed)
Im assuming pt is okay with waiting for your reply.

## 2022-11-14 NOTE — Telephone Encounter (Signed)
Pls schedule pt for Gyn u/s. Thx.

## 2022-11-15 ENCOUNTER — Encounter: Payer: Self-pay | Admitting: Obstetrics and Gynecology

## 2022-11-15 ENCOUNTER — Ambulatory Visit
Admission: RE | Admit: 2022-11-15 | Discharge: 2022-11-15 | Disposition: A | Discharge status: Home / Self Care | Payer: BC Managed Care – PPO | Source: Ambulatory Visit | Attending: Obstetrics and Gynecology | Admitting: Obstetrics and Gynecology

## 2022-11-15 ENCOUNTER — Ambulatory Visit (INDEPENDENT_AMBULATORY_CARE_PROVIDER_SITE_OTHER): Payer: BC Managed Care – PPO | Admitting: Obstetrics and Gynecology

## 2022-11-15 VITALS — BP 110/84 | HR 86 | Wt 184.0 lb

## 2022-11-15 DIAGNOSIS — Z1231 Encounter for screening mammogram for malignant neoplasm of breast: Secondary | ICD-10-CM | POA: Insufficient documentation

## 2022-11-15 DIAGNOSIS — N939 Abnormal uterine and vaginal bleeding, unspecified: Secondary | ICD-10-CM

## 2022-11-15 NOTE — Progress Notes (Signed)
Berniece Salines, FNP   Chief Complaint  Patient presents with   Follow-up    HPI:      [name] is a 62 y.o. [phone] whose LMP was No LMP recorded. Patient has had an ablation., presents today for f/u bleeding after endocx polyp removed 10/17/22. Hemostasis achieved with removal. Pt started having red bleeding a few days later, lasted about 1 1/2 wks, stopped about a week ago. Was light, needed to wear pantyliner; noticed more with wiping. No dysmen/pain. Wasn't sexually active in between polypectomy and bleeding. Question PMB vs bleeding from polpy removal (although unlikely too). No PMB prior to annual/polypectomy. Neg pap/neg HPV DNA 6/22. Gyn u/s already scheduled.   Patient Active Problem List   Diagnosis Date Noted   Type 2 diabetes mellitus with hyperglycemia, without long-term current use of insulin (HCC) 12/13/2021   Encounter to establish care 12/13/2021   Hyperlipidemia 12/13/2021   Obesity (BMI 30-39.9) 12/13/2021   Dry eyes 10/08/2020   Dry mouth 10/08/2020   Hair loss 10/08/2020    Past Surgical History:  Procedure Laterality Date   ABLATION     BREAST BIOPSY Left    benign   OTHER SURGICAL HISTORY     Bladder lifted, tummy tuck    Family History  Problem Relation Age of Onset   Graves' disease Mother    Cancer Mother    Breast cancer Mother 53   Graves' disease Sister    Graves' disease Brother    Diabetes Maternal Grandfather     Social History   Socioeconomic History   Marital status: Married    Spouse name: Not on file   Number of children: 4   Years of education: Not on file   Highest education level: Not on file  Occupational History   Not on file  Tobacco Use   Smoking status: Never   Smokeless tobacco: Never  Vaping Use   Vaping status: Never Used  Substance and Sexual Activity   Alcohol use: Not Currently   Drug use: Never   Sexual activity: Yes    Birth control/protection: Surgical    Comment: Ablation  Other  Topics Concern   Not on file  Social History Narrative   Not on file   Social Determinants of Health   Financial Resource Strain: Not on file  Food Insecurity: Not on file  Transportation Needs: Not on file  Physical Activity: Not on file  Stress: Not on file  Social Connections: Not on file  Intimate Partner Violence: Not on file    No outpatient medications prior to visit.   No facility-administered medications prior to visit.      ROS:  Review of Systems  Constitutional:  Negative for fever.  Gastrointestinal:  Negative for blood in stool, constipation, diarrhea, nausea and vomiting.  Genitourinary:  Positive for vaginal bleeding. Negative for dyspareunia, dysuria, flank pain, frequency, hematuria, urgency, vaginal discharge and vaginal pain.  Musculoskeletal:  Negative for back pain.  Skin:  Negative for rash.   BREAST: No symptoms   OBJECTIVE:   Vitals:  BP 110/84   Pulse 86   Wt 184 lb (83.5 kg)   BMI 33.65 kg/m   Physical Exam Vitals reviewed.  Constitutional:      Appearance: She is well-developed.  Pulmonary:     Effort: Pulmonary effort is normal.  Genitourinary:    General: Normal vulva.     Pubic Area: No rash.      Labia:  Right: No rash, tenderness or lesion.        Left: No rash, tenderness or lesion.      Vagina: Normal. No vaginal discharge, erythema or tenderness.     Cervix: Normal. No friability, lesion or cervical bleeding.     Comments: NEG CX EXAM; USED CERVICAL BRUSH AND COULDN'T ELICIT ANY BLEEDING Musculoskeletal:        General: Normal range of motion.     Cervical back: Normal range of motion.  Skin:    General: Skin is warm and dry.  Neurological:     General: No focal deficit present.     Mental Status: She is alert and oriented to person, place, and time.  Psychiatric:        Mood and Affect: Mood normal.        Behavior: Behavior normal.        Thought Content: Thought content normal.        Judgment:  Judgment normal.     Assessment/Plan: Vaginal bleeding--neg cx exam. Has GYN u/s appt scheduled 12/12/22. Will f/u with results. Question bleeding after polypectomy vs PMB. F/u sooner if bleeding resumes.     Return if symptoms worsen or fail to improve.  Ralphael Southgate B. Emrie Gayle, PA-C 11/15/2022 3:02 PM

## 2022-11-15 NOTE — Patient Instructions (Signed)
I value your feedback and you entrusting us with your care. If you get a [facility] patient survey, I would appreciate you taking the time to let us know about your experience today. Thank you! ? ? ?

## 2022-11-21 ENCOUNTER — Ambulatory Visit: Payer: BC Managed Care – PPO

## 2022-11-21 ENCOUNTER — Other Ambulatory Visit: Payer: Self-pay

## 2022-11-21 ENCOUNTER — Ambulatory Visit: Payer: BC Managed Care – PPO | Admitting: Nurse Practitioner

## 2022-11-21 ENCOUNTER — Encounter: Payer: Self-pay | Admitting: Nurse Practitioner

## 2022-11-21 VITALS — BP 122/76 | HR 98 | Temp 98.1°F | Resp 16 | Ht 62.0 in | Wt 183.7 lb

## 2022-11-21 DIAGNOSIS — E785 Hyperlipidemia, unspecified: Secondary | ICD-10-CM | POA: Diagnosis not present

## 2022-11-21 DIAGNOSIS — R002 Palpitations: Secondary | ICD-10-CM | POA: Diagnosis not present

## 2022-11-21 DIAGNOSIS — E1165 Type 2 diabetes mellitus with hyperglycemia: Secondary | ICD-10-CM

## 2022-11-21 DIAGNOSIS — Z8349 Family history of other endocrine, nutritional and metabolic diseases: Secondary | ICD-10-CM | POA: Diagnosis not present

## 2022-11-21 LAB — CBC WITH DIFFERENTIAL/PLATELET
Absolute Monocytes: 348 cells/uL (ref 200–950)
Basophils Absolute: 68 cells/uL (ref 0–200)
Basophils Relative: 1.2 %
Lymphs Abs: 2252 cells/uL (ref 850–3900)
MCV: 85.9 fL (ref 80.0–100.0)
Platelets: 266 10*3/uL (ref 140–400)
RDW: 12.3 % (ref 11.0–15.0)
WBC: 5.7 10*3/uL (ref 3.8–10.8)

## 2022-11-21 NOTE — Assessment & Plan Note (Signed)
Continue with lifestyle modification.  

## 2022-11-21 NOTE — Progress Notes (Signed)
BP 122/76   Pulse 98   Temp 98.1 F (36.7 C) (Oral)   Resp 16   Ht 5\' 2"  (1.575 m)   Wt 183 lb 11.2 oz (83.3 kg)   SpO2 99%   BMI 33.60 kg/m    Subjective:    Patient ID: Emma Richards, female    DOB: 1960-10-30, 62 y.o.   MRN: [id number]  HPI: [name] is a 62 y.o. female  Chief Complaint  Patient presents with   Palpitations   Thyroid Problem    Losing, hair, dry mouth, 3 family members tested positive for Graves Disease   Diabetes, Type 2:  -Last A1c 6.1 -Medications: none -Diet: recommend decreasing sugar and processed foods in diet -Exercise: recommend at least 150 min of physical activity -Eye exam: due -Foot exam: due -Microalbumin:utd -Statin: none -Denies symptoms of hypoglycemia, polyuria, polydipsia, numbness extremities, foot ulcers/trauma.  Will get labs today HLD:  -Medications: none -Last lipid panel:  Lipid Panel     Component Value Date/Time   CHOL 203 (H) 12/13/2021 1532   CHOL 195 10/09/2020 0820   TRIG 209 (H) 12/13/2021 1532   HDL 57 12/13/2021 1532   HDL 48 10/09/2020 0820   CHOLHDL 3.6 12/13/2021 1532   LDLCALC 113 (H) 12/13/2021 1532   LABVLDL 21 10/09/2020 0820    The 10-year ASCVD risk score (Arnett DK, et al., 2019) is: 6.3%   Values used to calculate the score:     Age: 71 years     Sex: Female     Is Non-Hispanic African American: No     Diabetic: Yes     Tobacco smoker: No     Systolic Blood Pressure: 122 mmHg     Is BP treated: No     HDL Cholesterol: 57 mg/dL     Total Cholesterol: 203 mg/dL   Obesity:  Current weight : 183 lbs BMI: 33.60 Previous weight:183 lbs Treatment Tried: lifestyle modification Comorbidities: HLD, DM    Palpitations: she says that she has had palpitations for a long time. She says over the last few weeks it has gotten worse.  She denies any shortness of breath, chest pain.  She says coughing helps. Nothing she knows of brings it on.  It even happened once sitting on a  beach under an umbrella. Will get labs, EKG and zio patch. EKG showed no concerns.    Family history of Graves disease: continues to have hair loss, dry mouth.  Will get labs.   Relevant past medical, surgical, family and social history reviewed and updated as indicated. Interim medical history since our last visit reviewed. Allergies and medications reviewed and updated.  Review of Systems  Constitutional: Negative for fever or weight change.  Respiratory: Negative for cough and shortness of breath.   Cardiovascular: Negative for chest pain, positive for  palpitations.  Gastrointestinal: Negative for abdominal pain, no bowel changes.  Musculoskeletal: Negative for gait problem or joint swelling.  Skin: Negative for rash.  Neurological: Negative for dizziness or headache.  No other specific complaints in a complete review of systems (except as listed in HPI above).      Objective:    BP 122/76   Pulse 98   Temp 98.1 F (36.7 C) (Oral)   Resp 16   Ht 5\' 2"  (1.575 m)   Wt 183 lb 11.2 oz (83.3 kg)   SpO2 99%   BMI 33.60 kg/m   Wt Readings from Last 3 Encounters:  11/21/22  183 lb 11.2 oz (83.3 kg)  11/15/22 184 lb (83.5 kg)  10/17/22 182 lb (82.6 kg)    Physical Exam  Constitutional: Patient appears well-developed and well-nourished. Obese  No distress.  HEENT: head atraumatic, normocephalic, pupils equal and reactive to light, neck supple, throat within normal limits Cardiovascular: Normal rate, regular rhythm and normal heart sounds.  No murmur heard. No BLE edema. Pulmonary/Chest: Effort normal and breath sounds normal. No respiratory distress. Abdominal: Soft.  There is no tenderness. Psychiatric: Patient has a normal mood and affect. behavior is normal. Judgment and thought content normal.  Diabetic Foot Exam - Simple   Simple Foot Form Diabetic Foot exam was performed with the following findings: Yes 11/21/2022  3:42 PM  Visual Inspection No deformities, no  ulcerations, no other skin breakdown bilaterally: Yes Sensation Testing Intact to touch and monofilament testing bilaterally: Yes Pulse Check Posterior Tibialis and Dorsalis pulse intact bilaterally: Yes Comments     Results for orders placed or performed in visit on 10/17/22  Surgical pathology  Result Value Ref Range   SURGICAL PATHOLOGY      SURGICAL PATHOLOGY CASE: [id number] PATIENT: Delorise Jackson Surgical Pathology Report     Clinical History: endocervical polyp (cm)     FINAL MICROSCOPIC DIAGNOSIS:  A. ENDOCERVIX, POLYPECTOMY: Benign endocervix and mucus consistent with polypoid nabothian cyst.   GROSS DESCRIPTION:  A. Received in formalin labeled with the patients name and DOB is a 0.7 x 0.7 x 0.2 cm aggregate of tan soft tissue fragments engrossed in mucoid material, submitted in toto in a single cassette.  (LEF 10/17/2022)   Final Diagnosis performed by Jimmy Picket, MD.   Electronically signed 10/18/2022 Technical and / or Professional components performed at [facility]. [facility], [address] [place], [place] [address].  Immunohistochemistry Technical component (if applicable) was performed at [facility]. [address], [place], [place] [address].   IMMUNOHISTOCHEMISTRY DISCLAIMER (if applicable): Some of these immunohistochemical stain s may have been developed and the performance characteristics determine by [facility]. Some may not have been cleared or approved by the U.S. Food and Drug Administration. The FDA has determined that such clearance or approval is not necessary. This test is used for clinical purposes. It should not be regarded as investigational or for research. This laboratory is certified under the Clinical Laboratory Improvement Amendments of 1988 (CLIA-88) as qualified to perform high complexity clinical laboratory testing.  The controls stained appropriately.   IHC stains  are performed on formalin fixed, paraffin embedded tissue using a 3,3"diaminobenzidine (DAB) chromogen and Leica Bond Autostainer System. The staining intensity of the nucleus is score manually and is reported as the percentage of tumor cell nuclei demonstrating specific nuclear staining. The specimens are fixed in 10% Neutral Formalin for at least 6 hours and up to 72hrs. These tests are validated on  decalcified tissue. Results should be interpreted with caution given the possibility of false negative results on decalcified specimens. Antibody Clones are as follows ER-clone 16F, PR-clone 16, Ki67- clone MM1. Some of these immunohistochemical stains may have been developed and the performance characteristics determined by [facility].       Assessment & Plan:   Problem List Items Addressed This Visit       Endocrine   Type 2 diabetes mellitus with hyperglycemia, without long-term current use of insulin (HCC) - Primary    Continue with life style modification.        Relevant Orders  COMPLETE METABOLIC PANEL WITH GFR   Hemoglobin A1c   HM Diabetes Foot Exam (Completed)     Other   Hyperlipidemia    Continue with life style modification      Relevant Orders   Lipid panel   Other Visit Diagnoses     Palpitations       ekg, labs, zio patch   Relevant Orders   CBC with Differential/Platelet   COMPLETE METABOLIC PANEL WITH GFR   Thyroid Panel With TSH   EKG 12-Lead   LONG TERM MONITOR (3-14 DAYS)   Family history of Graves' disease       labs ordered   Relevant Orders   Thyroid Panel With TSH   TRAb (TSH Receptor Binding Antibody)        Follow up plan: Return for follow up, after results.

## 2022-11-22 LAB — COMPLETE METABOLIC PANEL WITH GFR
AG Ratio: 1.7 (calc) (ref 1.0–2.5)
ALT: 23 U/L (ref 6–29)
Albumin: 4.3 g/dL (ref 3.6–5.1)
BUN: 13 mg/dL (ref 7–25)
CO2: 28 mmol/L (ref 20–32)
Calcium: 9.2 mg/dL (ref 8.6–10.4)
Chloride: 105 mmol/L (ref 98–110)
Total Bilirubin: 1 mg/dL (ref 0.2–1.2)
eGFR: 77 mL/min/{1.73_m2} (ref >=60)

## 2022-11-22 LAB — THYROID PANEL WITH TSH
Free Thyroxine Index: 1.6 (ref 1.4–3.8)
T4, Total: 5.6 ug/dL (ref 5.1–11.9)

## 2022-11-22 LAB — CBC WITH DIFFERENTIAL/PLATELET
HCT: 41.9 % (ref 35.0–45.0)
Hemoglobin: 13.9 g/dL (ref 11.7–15.5)
MCH: 28.5 pg (ref 27.0–33.0)
MPV: 11.5 fL (ref 7.5–12.5)
Monocytes Relative: 6.1 %
Neutrophils Relative %: 51.8 %
Total Lymphocyte: 39.5 %

## 2022-11-22 LAB — LIPID PANEL
LDL Cholesterol (Calc): 108 mg/dL (calc) — ABNORMAL HIGH
Non-HDL Cholesterol (Calc): 144 mg/dL (calc) — ABNORMAL HIGH (ref <130)

## 2022-11-22 LAB — HEMOGLOBIN A1C: Mean Plasma Glucose: 140 mg/dL

## 2022-11-23 LAB — CBC WITH DIFFERENTIAL/PLATELET
Eosinophils Absolute: 80 cells/uL (ref 15–500)
Eosinophils Relative: 1.4 %
MCHC: 33.2 g/dL (ref 32.0–36.0)
Neutro Abs: 2953 cells/uL (ref 1500–7800)
RBC: 4.88 10*6/uL (ref 3.80–5.10)

## 2022-11-23 LAB — COMPLETE METABOLIC PANEL WITH GFR
AST: 19 U/L (ref 10–35)
Alkaline phosphatase (APISO): 77 U/L (ref 37–153)
Creat: 0.86 mg/dL (ref 0.50–1.05)
Globulin: 2.5 g/dL (calc) (ref 1.9–3.7)
Glucose, Bld: 189 mg/dL — ABNORMAL HIGH (ref 65–99)
Potassium: 4.6 mmol/L (ref 3.5–5.3)
Sodium: 142 mmol/L (ref 135–146)
Total Protein: 6.8 g/dL (ref 6.1–8.1)

## 2022-11-23 LAB — TRAB (TSH RECEPTOR BINDING ANTIBODY): TRAB: <1 IU/L (ref <=2.00)

## 2022-11-23 LAB — THYROID PANEL WITH TSH
T3 Uptake: 28 % (ref 22–35)
TSH: 1.95 mIU/L (ref 0.40–4.50)

## 2022-11-23 LAB — LIPID PANEL
Cholesterol: 201 mg/dL — ABNORMAL HIGH (ref <200)
HDL: 57 mg/dL (ref >=50)
Total CHOL/HDL Ratio: 3.5 (calc) (ref <5.0)
Triglycerides: 236 mg/dL — ABNORMAL HIGH (ref <150)

## 2022-11-23 LAB — HEMOGLOBIN A1C
Hgb A1c MFr Bld: 6.5 % of total Hgb — ABNORMAL HIGH (ref <5.7)
eAG (mmol/L): 7.7 mmol/L

## 2022-12-12 ENCOUNTER — Ambulatory Visit (INDEPENDENT_AMBULATORY_CARE_PROVIDER_SITE_OTHER): Payer: BC Managed Care – PPO

## 2022-12-12 DIAGNOSIS — N939 Abnormal uterine and vaginal bleeding, unspecified: Secondary | ICD-10-CM

## 2022-12-12 DIAGNOSIS — N950 Postmenopausal bleeding: Secondary | ICD-10-CM

## 2022-12-13 ENCOUNTER — Telehealth: Payer: Self-pay | Admitting: Obstetrics and Gynecology

## 2022-12-13 NOTE — Telephone Encounter (Signed)
Pt with PMB shortly after removal of endocx polyp. GYN u/s shows EM=1.7 mm; pt hasWithin the uterus there is a heterogenous mass with vascular flow in the LUS/cervix measuring ; 4.08 x 2.69 x 3.45 cm. Most likely polpy in cx canal. Pt to RTO with MD for further mgmt.

## 2023-01-17 ENCOUNTER — Ambulatory Visit (INDEPENDENT_AMBULATORY_CARE_PROVIDER_SITE_OTHER): Payer: BC Managed Care – PPO | Admitting: Obstetrics and Gynecology

## 2023-01-17 ENCOUNTER — Encounter: Payer: Self-pay | Admitting: Obstetrics and Gynecology

## 2023-01-17 VITALS — BP 165/74 | HR 85 | Resp 16 | Ht 62.0 in | Wt 184.0 lb

## 2023-01-17 DIAGNOSIS — N9489 Other specified conditions associated with female genital organs and menstrual cycle: Secondary | ICD-10-CM | POA: Diagnosis not present

## 2023-01-17 DIAGNOSIS — N950 Postmenopausal bleeding: Secondary | ICD-10-CM

## 2023-01-17 DIAGNOSIS — R3915 Urgency of urination: Secondary | ICD-10-CM | POA: Diagnosis not present

## 2023-01-17 LAB — POCT URINALYSIS DIPSTICK
Bilirubin, UA: NEGATIVE
Blood, UA: NEGATIVE
Glucose, UA: NEGATIVE
Ketones, UA: NEGATIVE
Leukocytes, UA: NEGATIVE
Nitrite, UA: NEGATIVE
Protein, UA: NEGATIVE
Spec Grav, UA: 1.015 (ref 1.010–1.025)
Urobilinogen, UA: 0.2 U/dL
pH, UA: 5 (ref 5.0–8.0)

## 2023-01-17 NOTE — Patient Instructions (Signed)
GYNECOLOGY PRE-OPERATIVE INSTRUCTIONS  You are scheduled for surgery on 02/13/2023.  The name of your procedure is: Hysteroscopy D&C with polypectomy.   Please read through these instructions carefully regarding preparation for your surgery:   Nothing to eat after midnight on the day prior to surgery.  Do not take any medications unless recommended by your provider on day prior to surgery.  Do not take NSAIDs (Motrin, Aleve) or aspirin 7 days prior to surgery.  You may take Tylenol products for minor aches and pains.  You will receive a prescription for pain medications post-operatively.  You will be contacted by phone approximately 1-2 weeks prior to surgery to schedule your pre-operative appointment.  You will need someone to drive you to and from the hospital.  You will not be allowed to drive on the day of surgery. Please call the office if you have any questions regarding your upcoming surgery.    Thank you for choosing  OB/GYN at [facility].

## 2023-01-17 NOTE — H&P (Incomplete)
GYNECOLOGY PREOPERATIVE HISTORY AND PHYSICAL   Subjective:  Emma Richards is a 62 y.o. [phone] here for surgical management of ***.   Indications for procedure include: ***. No significant preoperative concerns.  Proposed surgery: ***    Pertinent Gynecological History: Menses: {menses:16152} Bleeding: {uterine bleeding:32112} Contraception: {contraception:5051} Last mammogram: {normal/abnormal***:32111} Date: *** Last pap: {normal/abnormal***:32111} Date: ***   Past Medical History:  Diagnosis Date   Screening for colon cancer 06/2021   NEG cologuard, repeat after 3 yrs   Type 2 diabetes mellitus (HCC)    Past Surgical History:  Procedure Laterality Date   ABLATION     BREAST BIOPSY Left    benign   OTHER SURGICAL HISTORY     Bladder lifted, tummy tuck   OB History  Gravida Para Term Preterm AB Living  3 3 3     3   SAB IAB Ectopic Multiple Live Births          3    # Outcome Date GA Lbr Len/2nd Weight Sex Type Anes PTL Lv  3 Term           2 Term           1 Term           Patient denies any other pertinent gynecologic issues.  Family History  Problem Relation Age of Onset   Graves' disease Mother    Cancer Mother    Breast cancer Mother 22   Graves' disease Sister    Graves' disease Brother    Diabetes Maternal Grandfather    Social History   Socioeconomic History   Marital status: Married    Spouse name: Not on file   Number of children: 4   Years of education: Not on file   Highest education level: Master's degree (e.g., MA, MS, MEng, MEd, MSW, MBA)  Occupational History   Not on file  Tobacco Use   Smoking status: Never   Smokeless tobacco: Never  Vaping Use   Vaping status: Never Used  Substance and Sexual Activity   Alcohol use: Not Currently   Drug use: Never   Sexual activity: Yes    Birth control/protection: Surgical    Comment: Ablation  Other Topics Concern   Not on file  Social History Narrative   Not on file    Social Determinants of Health   Financial Resource Strain: Low Risk  (11/18/2022)   Overall Financial Resource Strain (CARDIA)    Difficulty of Paying Living Expenses: Not hard at all  Food Insecurity: No Food Insecurity (11/18/2022)   Hunger Vital Sign    Worried About Running Out of Food in the Last Year: Never true    Ran Out of Food in the Last Year: Never true  Transportation Needs: No Transportation Needs (11/18/2022)   PRAPARE - Administrator, Civil Service (Medical): No    Lack of Transportation (Non-Medical): No  Physical Activity: Insufficiently Active (11/18/2022)   Exercise Vital Sign    Days of Exercise per Week: 2 days    Minutes of Exercise per Session: 60 min  Stress: No Stress Concern Present (11/18/2022)   [employer] of Occupational Health - Occupational Stress Questionnaire    Feeling of Stress : Only a little  Social Connections: Moderately Isolated (11/18/2022)   Social Connection and Isolation Panel [NHANES]    Frequency of Communication with Friends and Family: More than three times a week    Frequency of Social Gatherings with Friends  and Family: Once a week    Attends Religious Services: Never    Active Member of Clubs or Organizations: No    Attends Engineer, structural: Not on file    Marital Status: Married  Catering manager Violence: Not on file   No current outpatient medications on file prior to visit.   No current facility-administered medications on file prior to visit.   No Known Allergies    Review of Systems Constitutional: No recent fever/chills/sweats Respiratory: No recent cough/bronchitis Cardiovascular: No chest pain Gastrointestinal: No recent nausea/vomiting/diarrhea Genitourinary: No UTI symptoms Hematologic/lymphatic:No history of coagulopathy or recent blood thinner use    Objective:   Blood pressure (!) 165/74, pulse 85, resp. rate 16, height 5\' 2"  (1.575 m), weight 184 lb (83.5  kg). CONSTITUTIONAL: Well-developed, well-nourished female in no acute distress.  HENT:  Normocephalic, atraumatic, External right and left ear normal. Oropharynx is clear and moist EYES: Conjunctivae and EOM are normal. Pupils are equal, round, and reactive to light. No scleral icterus.  NECK: Normal range of motion, supple, no masses SKIN: Skin is warm and dry. No rash noted. Not diaphoretic. No erythema. No pallor. NEUROLOGIC: Alert and oriented to person, place, and time. Normal reflexes, muscle tone coordination. No cranial nerve deficit noted. PSYCHIATRIC: Normal mood and affect. Normal behavior. Normal judgment and thought content. CARDIOVASCULAR: Normal heart rate noted, regular rhythm RESPIRATORY: Effort and breath sounds normal, no problems with respiration noted ABDOMEN: Soft, nontender, nondistended. PELVIC: Deferred MUSCULOSKELETAL: Normal range of motion. No edema and no tenderness. 2+ distal pulses.    Labs: Results for orders placed or performed in visit on 01/17/23 (from the past 336 hour(s))  POCT Urinalysis Dipstick   Collection Time: 01/17/23  3:33 PM  Result Value Ref Range   Color, UA yellow    Clarity, UA clear    Glucose, UA Negative Negative   Bilirubin, UA negative    Ketones, UA negative    Spec Grav, UA 1.015 1.010 - 1.025   Blood, UA negative    pH, UA 5.0 5.0 - 8.0   Protein, UA Negative Negative   Urobilinogen, UA 0.2 0.2 or 1.0 E.U./dL   Nitrite, UA negative    Leukocytes, UA Negative Negative   Appearance     Odor       Imaging Studies: No results found.  Assessment:     ***      Plan:    Counseling: Procedure, risks, reasons, benefits and complications (including injury to bowel, bladder, major blood vessel, ureter, bleeding, possibility of transfusion, infection, or fistula formation) reviewed in detail. Likelihood of success in alleviating the patient's condition was discussed. Routine postoperative instructions will be reviewed with  the patient and her family in detail after surgery.  The patient concurred with the proposed plan, giving informed written consent for the surgery.   Preop testing ordered. Instructions reviewed, including NPO after midnight.      Hildred Laser, MD [facility]

## 2023-01-17 NOTE — Progress Notes (Unsigned)
GYNECOLOGY PROGRESS NOTE  Subjective:    Patient ID: Emma Richards, female    DOB: 22-Jun-1960, 62 y.o.   MRN: [id number]  HPI  Patient is a postmenopausal 62 y.o. [id number] female who presents for consult for endometrial mass.  She was referred by Althea Grimmer, PA-C.  She underwent a polypectomy in office during her annual exam in June of this year, however since that time, she has continued to have intermittent vaginal bleeding.  Denied any pelvic pain at that time.  Patient had a recent ultrasound noting a 4 cm mass in the lower uterine segment that is hypervascular (most likely a another polyp).  Was sent today to discuss further management.   Patient also has questions regarding urinary symptoms.  Notes a history of bladder tack in the past.  Currently has been experiencing urinary urgency and some occasional twinges of discomfort right before voiding.  Typically only voids small amounts when she uses the restroom.  Denies back pain, urinary frequency or hematuria.   The following portions of the patient's history were reviewed and updated as appropriate:   She  has a past medical history of Screening for colon cancer (06/2021) and Type 2 diabetes mellitus (HCC).  She  has a past surgical history that includes Other surgical history; Ablation; and Breast biopsy (Left).  Her family history includes Breast cancer (age of onset: 19) in her mother; Cancer in her mother; Diabetes in her maternal grandfather; Luiz Blare' disease in her brother, mother, and sister.  She  reports that she has never smoked. She has never used smokeless tobacco. She reports that she does not currently use alcohol. She reports that she does not use drugs.  No current outpatient medications on file prior to visit.   No current facility-administered medications on file prior to visit.   She has No Known Allergies..  Review of Systems Pertinent items noted in HPI and remainder of comprehensive ROS  otherwise negative.   Objective:   Blood pressure (!) 151/82, pulse 91, resp. rate 16, height 5\' 2"  (1.575 m), weight 184 lb (83.5 kg). Body mass index is 33.65 kg/m. General appearance: alert and no distress Lungs: clear to auscultation bilaterally Heart: regular rate and rhythm, S1, S2 normal, no murmur, click, rub or gallop  Abdomen: soft, non-tender; bowel sounds normal; no masses,  no organomegaly Pelvic: deferred Extremities: extremities normal, atraumatic, no cyanosis or edema Neurologic: Grossly normal   Labs:  Results for orders placed or performed in visit on 01/17/23  POCT Urinalysis Dipstick  Result Value Ref Range   Color, UA yellow    Clarity, UA clear    Glucose, UA Negative Negative   Bilirubin, UA negative    Ketones, UA negative    Spec Grav, UA 1.015 1.010 - 1.025   Blood, UA negative    pH, UA 5.0 5.0 - 8.0   Protein, UA Negative Negative   Urobilinogen, UA 0.2 0.2 or 1.0 E.U./dL   Nitrite, UA negative    Leukocytes, UA Negative Negative   Appearance     Odor      Assessment:   1. PMB (postmenopausal bleeding)   2. Endometrial mass   3. Urinary urgency      Plan:   1. PMB (postmenopausal bleeding) -Postmenopausal bleeding likely secondary to her endometrial mass (likely suspected to be a polyp).  Patient did recently have an external polyp removed about 2 to 3 months ago.  Bleeding will most likely resolve once the mass  is removed.  2. Endometrial mass Discussion had regarding ultrasound findings.  Patient with likely endometrial polyp (however cannot definitively rule out other masses such as uterine fibroid or malignancy).  In the setting of postmenopausal bleeding, it is recommended for polyp removal.  This can be performed by Hysteroscopy D&C with polypectomy. Discussed nature of procedure, including risks and benefits. Advised that the likelihood of resolution of her symptoms was high. All questions answered. Patient notes understanding.  She is  ok with recommended procedure and will plan to schedule surgery for 02/13/2023.  Pre-op done today.  Given pre-operative instructions.    3. Urinary urgency - POCT Urinalysis Dipstick performed today was negative.  I also discussed the possibility that patient may be developing some overactive bladder symptoms as on further discussion majority of her symptoms did resemble this diagnosis.  Can further assess symptoms and consider management options after her surgery is completed.   A total of 28 minutes were spent during this encounter, including review of previous progress notes, recent imaging and labs, face-to-face with time with patient involving counseling and coordination of care, as well as documentation for current visit.   Hildred Laser, MD [facility]

## 2023-01-18 ENCOUNTER — Encounter: Payer: Self-pay | Admitting: Obstetrics and Gynecology

## 2023-01-18 ENCOUNTER — Telehealth: Payer: Self-pay | Admitting: Obstetrics and Gynecology

## 2023-01-18 NOTE — Telephone Encounter (Signed)
This patient is scheduled for surgery on 10/14. She is needing to reschedule due to conflict. Please contact this patient.

## 2023-01-19 NOTE — Telephone Encounter (Signed)
10/21 will be better

## 2023-01-19 NOTE — Telephone Encounter (Signed)
Hey Dr. Valentino Saxon I have reached out to the patient and the patient is interested in having the surgery rescheduled on 02/06/2023 or 02/20/2023. Please advise which works better for you.

## 2023-02-01 DIAGNOSIS — L988 Other specified disorders of the skin and subcutaneous tissue: Secondary | ICD-10-CM | POA: Diagnosis not present

## 2023-02-01 DIAGNOSIS — L658 Other specified nonscarring hair loss: Secondary | ICD-10-CM | POA: Diagnosis not present

## 2023-02-01 DIAGNOSIS — L609 Nail disorder, unspecified: Secondary | ICD-10-CM | POA: Diagnosis not present

## 2023-02-01 DIAGNOSIS — L810 Postinflammatory hyperpigmentation: Secondary | ICD-10-CM | POA: Diagnosis not present

## 2023-02-10 ENCOUNTER — Encounter: Payer: Self-pay | Admitting: Nurse Practitioner

## 2023-02-13 ENCOUNTER — Encounter
Admission: RE | Admit: 2023-02-13 | Discharge: 2023-02-13 | Disposition: A | Discharge status: Home / Self Care | Payer: BC Managed Care – PPO | Source: Ambulatory Visit | Attending: Obstetrics and Gynecology | Admitting: Obstetrics and Gynecology

## 2023-02-13 VITALS — Ht 62.0 in | Wt 184.1 lb

## 2023-02-13 DIAGNOSIS — Z01818 Encounter for other preprocedural examination: Secondary | ICD-10-CM

## 2023-02-13 HISTORY — DX: Postmenopausal bleeding: N95.0

## 2023-02-13 HISTORY — DX: Hyperlipidemia, unspecified: E78.5

## 2023-02-13 HISTORY — DX: Urgency of urination: R39.15

## 2023-02-13 HISTORY — DX: Other specified conditions associated with female genital organs and menstrual cycle: N94.89

## 2023-02-13 HISTORY — DX: Obesity, unspecified: E66.9

## 2023-02-13 HISTORY — DX: Nonscarring hair loss, unspecified: L65.9

## 2023-02-13 HISTORY — DX: Gastro-esophageal reflux disease without esophagitis: K21.9

## 2023-02-13 NOTE — Patient Instructions (Signed)
Your procedure is scheduled on:02-20-23 Monday Report to the Registration Desk on the 1st floor of the [facility]. To find out your arrival time, please call [phone] between 1PM - 3PM on:02-17-23 Friday If your arrival time is 6:00 am, do not arrive before that time as the [facility] entrance doors do not open until 6:00 am.  REMEMBER: Instructions that are not followed completely may result in serious medical risk, up to and including death; or upon the discretion of your surgeon and anesthesiologist your surgery may need to be rescheduled.  Do not eat food OR drink any liquids after midnight the night before surgery.  No gum chewing or hard candies.  One week prior to surgery: Stop Anti-inflammatories (NSAIDS) such as Advil, Aleve, Ibuprofen, Motrin, Naproxen, Naprosyn and Aspirin based products such as Excedrin, Goody's Powder, BC Powder. Stop ANY OVER THE COUNTER supplements/vitamins until after surgery.  You may however, continue to take Tylenol if needed for pain up until the day of surgery.  Continue taking all of your other prescription medications up until the day of surgery.  Do NOT take any medication the day of surgery  No Alcohol for 24 hours before or after surgery.  No Smoking including e-cigarettes for 24 hours before surgery.  No chewable tobacco products for at least 6 hours before surgery.  No nicotine patches on the day of surgery.  Do not use any "recreational" drugs for at least a week (preferably 2 weeks) before your surgery.  Please be advised that the combination of cocaine and anesthesia may have negative outcomes, up to and including death. If you test positive for cocaine, your surgery will be cancelled.  On the morning of surgery brush your teeth with toothpaste and water, you may rinse your mouth with mouthwash if you wish. Do not swallow any toothpaste or mouthwash.  Do not wear jewelry, make-up, hairpins, clips or nail polish.  For welded  (permanent) jewelry: bracelets, anklets, waist bands, etc.  Please have this removed prior to surgery.  If it is not removed, there is a chance that hospital personnel will need to cut it off on the day of surgery.  Do not wear lotions, powders, or perfumes.   Do not shave body hair from the neck down 48 hours before surgery.  Contact lenses, hearing aids and dentures may not be worn into surgery.  Do not bring valuables to the hospital. [facility] is not responsible for any missing/lost belongings or valuables.   Notify your doctor if there is any change in your medical condition (cold, fever, infection).  Wear comfortable clothing (specific to your surgery type) to the hospital.  After surgery, you can help prevent lung complications by doing breathing exercises.  Take deep breaths and cough every 1-2 hours. Your doctor may order a device called an Incentive Spirometer to help you take deep breaths. When coughing or sneezing, hold a pillow firmly against your incision with both hands. This is called "splinting." Doing this helps protect your incision. It also decreases belly discomfort.  If you are being admitted to the hospital overnight, leave your suitcase in the car. After surgery it may be brought to your room.  In case of increased patient census, it may be necessary for you, the patient, to continue your postoperative care in the Same Day Surgery department.  If you are being discharged the day of surgery, you will not be allowed to drive home. You will need a responsible individual to drive you  home and stay with you for 24 hours after surgery.   If you are taking public transportation, you will need to have a responsible individual with you.  Please call the Pre-admissions Testing Dept. at [phone] if you have any questions about these instructions.  Surgery Visitation Policy:  Patients having surgery or a procedure may have two visitors.  Children under the age of  12 must have an adult with them who is not the patient.

## 2023-02-16 ENCOUNTER — Encounter: Payer: Self-pay | Admitting: Nurse Practitioner

## 2023-02-16 ENCOUNTER — Encounter
Admission: RE | Admit: 2023-02-16 | Discharge: 2023-02-16 | Disposition: A | Discharge status: Home / Self Care | Payer: BC Managed Care – PPO | Source: Ambulatory Visit | Attending: Obstetrics and Gynecology | Admitting: Obstetrics and Gynecology

## 2023-02-16 ENCOUNTER — Ambulatory Visit: Payer: BC Managed Care – PPO | Admitting: Nurse Practitioner

## 2023-02-16 ENCOUNTER — Other Ambulatory Visit: Payer: Self-pay

## 2023-02-16 VITALS — BP 158/84 | HR 98 | Temp 97.7°F | Resp 16 | Ht 62.0 in | Wt 182.9 lb

## 2023-02-16 DIAGNOSIS — E669 Obesity, unspecified: Secondary | ICD-10-CM | POA: Diagnosis not present

## 2023-02-16 DIAGNOSIS — E1165 Type 2 diabetes mellitus with hyperglycemia: Secondary | ICD-10-CM | POA: Diagnosis not present

## 2023-02-16 DIAGNOSIS — Z01812 Encounter for preprocedural laboratory examination: Secondary | ICD-10-CM | POA: Diagnosis not present

## 2023-02-16 DIAGNOSIS — N950 Postmenopausal bleeding: Secondary | ICD-10-CM | POA: Diagnosis not present

## 2023-02-16 DIAGNOSIS — I10 Essential (primary) hypertension: Secondary | ICD-10-CM | POA: Diagnosis not present

## 2023-02-16 LAB — CBC
HCT: 41.5 % (ref 36.0–46.0)
Hemoglobin: 14.3 g/dL (ref 12.0–15.0)
MCH: 28.4 pg (ref 26.0–34.0)
MCHC: 34.5 g/dL (ref 30.0–36.0)
MCV: 82.3 fL (ref 80.0–100.0)
Platelets: 279 10*3/uL (ref 150–400)
RBC: 5.04 MIL/uL (ref 3.87–5.11)
RDW: 12.2 % (ref 11.5–15.5)
WBC: 4.3 10*3/uL (ref 4.0–10.5)
nRBC: 0 % (ref 0.0–0.2)

## 2023-02-16 MED ORDER — TIRZEPATIDE 2.5 MG/0.5ML ~~LOC~~ SOAJ: 2.5000 mg | SUBCUTANEOUS | 0 refills | Status: DC

## 2023-02-16 NOTE — Assessment & Plan Note (Signed)
Try to get approved for [facility]

## 2023-02-16 NOTE — Progress Notes (Signed)
BP (!) 158/84   Pulse 98   Temp 97.7 F (36.5 C) (Oral)   Resp 16   Ht 5\' 2"  (1.575 m)   Wt 182 lb 14.4 oz (83 kg)   SpO2 99%   BMI 33.45 kg/m    Subjective:    Patient ID: Emma Richards, female    DOB: 12-15-1960, 62 y.o.   MRN: [id number]  HPI: Emma Richards is a 62 y.o. female  Chief Complaint  Patient presents with   Hypertension   Obesity    Discuss weight loss options   Obesity:  Current weight : 182 lbs BMI: 33.45 Previous weight:184 lbs Treatment Tried: lifestyle modification Comorbidities: dm  Patient would like to see about taking GLP1 medication She denies any family history of thyroid cancer, or personal history of pancreatitis.   Diabetes, Type 2:  -Last A1c 6.5 -Medications: none, will try to get approved for mounjaro -Patient has been working on diet -Checking BG at home: does not check her blood sugar -Diet: reduce sugar and processed foods in your diet  -Exercise: recommend 150 min of physical activity weekly   -Eye exam: due -Foot exam: utd -Microalbumin: due -Statin: no -PNA vaccine: no -Denies symptoms of hypoglycemia, polyuria, polydipsia, numbness extremities, foot ulcers/trauma.    Elevated blood pressure reading:  patient reports her blood pressure have been elevated lately. She has never been on blood pressure medication.  Currently taking minoxidil for hair loss,but has not been taking it consistently. She will start taking it daily and will monitor blood pressure and bring log to next appointment.      02/16/2023    2:14 PM 02/13/2023   11:00 AM 01/17/2023    3:30 PM  Vitals with BMI  Height 5\' 2"  5\' 2"    Weight 182 lbs 14 oz 184 lbs 1 oz   BMI 33.44 33.66   Systolic 158  165  Diastolic 84  74  Pulse 98  85     Relevant past medical, surgical, family and social history reviewed and updated as indicated. Interim medical history since our last visit reviewed. Allergies and medications reviewed and  updated.  Review of Systems  Constitutional: Negative for fever or weight change.  Respiratory: Negative for cough and shortness of breath.   Cardiovascular: Negative for chest pain or palpitations.  Gastrointestinal: Negative for abdominal pain, no bowel changes.  Musculoskeletal: Negative for gait problem or joint swelling.  Skin: Negative for rash.  Neurological: Negative for dizziness or headache.  No other specific complaints in a complete review of systems (except as listed in HPI above).      Objective:    BP (!) 158/84   Pulse 98   Temp 97.7 F (36.5 C) (Oral)   Resp 16   Ht 5\' 2"  (1.575 m)   Wt 182 lb 14.4 oz (83 kg)   SpO2 99%   BMI 33.45 kg/m   Wt Readings from Last 3 Encounters:  02/16/23 182 lb 14.4 oz (83 kg)  02/13/23 184 lb 1.4 oz (83.5 kg)  01/17/23 184 lb (83.5 kg)    Physical Exam Constitutional: Patient appears well-developed and well-nourished. Obese  No distress.  HEENT: head atraumatic, normocephalic, pupils equal and reactive to light, neck supple, throat within normal limits Cardiovascular: Normal rate, regular rhythm and normal heart sounds.  No murmur heard. No BLE edema. Pulmonary/Chest: Effort normal and breath sounds normal. No respiratory distress. Abdominal: Soft.  There is no tenderness. Psychiatric: Patient has a normal  mood and affect. behavior is normal. Judgment and thought content normal.  Results for orders placed or performed during the hospital encounter of 02/16/23  CBC  Result Value Ref Range   WBC 4.3 4.0 - 10.5 K/uL   RBC 5.04 3.87 - 5.11 MIL/uL   Hemoglobin 14.3 12.0 - 15.0 g/dL   HCT 16.1 09.6 - 04.5 %   MCV 82.3 80.0 - 100.0 fL   MCH 28.4 26.0 - 34.0 pg   MCHC 34.5 30.0 - 36.0 g/dL   RDW 40.9 81.1 - 91.4 %   Platelets 279 150 - 400 K/uL   nRBC 0.0 0.0 - 0.2 %      Assessment & Plan:   Problem List Items Addressed This Visit       Endocrine   Type 2 diabetes mellitus with hyperglycemia, without long-term current  use of insulin (HCC) - Primary    Try to get approved for mounjaro      Relevant Medications   tirzepatide (MOUNJARO) 2.5 MG/0.5ML Pen     Other   Obesity (BMI 30-39.9)    Try to get approved for mounjaro      Relevant Medications   tirzepatide [facility]) 2.5 MG/0.5ML Pen   Other Visit Diagnoses     Primary hypertension       take minoxidil daily, keep log of blood pressure, reduce salt in diet        Follow up plan: Return in about 4 months (around 06/19/2023) for follow up.

## 2023-02-17 ENCOUNTER — Ambulatory Visit: Payer: BC Managed Care – PPO | Admitting: Nurse Practitioner

## 2023-02-20 ENCOUNTER — Ambulatory Visit: Payer: BC Managed Care – PPO | Admitting: General Practice

## 2023-02-20 ENCOUNTER — Encounter: Payer: Self-pay | Admitting: Obstetrics and Gynecology

## 2023-02-20 ENCOUNTER — Encounter
Admission: RE | Disposition: A | Discharge status: Home / Self Care | Payer: Self-pay | Source: Home / Self Care | Attending: Obstetrics and Gynecology

## 2023-02-20 ENCOUNTER — Other Ambulatory Visit: Payer: Self-pay

## 2023-02-20 ENCOUNTER — Ambulatory Visit
Admission: RE | Admit: 2023-02-20 | Discharge: 2023-02-20 | Disposition: A | Discharge status: Home / Self Care | Payer: BC Managed Care – PPO | Attending: Obstetrics and Gynecology | Admitting: Obstetrics and Gynecology

## 2023-02-20 DIAGNOSIS — Z01818 Encounter for other preprocedural examination: Secondary | ICD-10-CM

## 2023-02-20 DIAGNOSIS — E119 Type 2 diabetes mellitus without complications: Secondary | ICD-10-CM | POA: Diagnosis not present

## 2023-02-20 DIAGNOSIS — N840 Polyp of corpus uteri: Secondary | ICD-10-CM | POA: Diagnosis not present

## 2023-02-20 DIAGNOSIS — N841 Polyp of cervix uteri: Secondary | ICD-10-CM | POA: Insufficient documentation

## 2023-02-20 DIAGNOSIS — N858 Other specified noninflammatory disorders of uterus: Secondary | ICD-10-CM

## 2023-02-20 DIAGNOSIS — N8500 Endometrial hyperplasia, unspecified: Secondary | ICD-10-CM | POA: Diagnosis not present

## 2023-02-20 DIAGNOSIS — N950 Postmenopausal bleeding: Secondary | ICD-10-CM | POA: Diagnosis not present

## 2023-02-20 HISTORY — PX: DILATATION & CURETTAGE/HYSTEROSCOPY WITH MYOSURE: SHX6511

## 2023-02-20 LAB — GLUCOSE, CAPILLARY
Glucose-Capillary: 123 mg/dL — ABNORMAL HIGH (ref 70–99)
Glucose-Capillary: 107 mg/dL — ABNORMAL HIGH (ref 70–99)

## 2023-02-20 SURGERY — DILATATION & CURETTAGE/HYSTEROSCOPY WITH MYOSURE
Anesthesia: General | Site: Vagina

## 2023-02-20 MED ORDER — ONDANSETRON HCL 4 MG/2ML IJ SOLN
INTRAMUSCULAR | Status: DC | PRN
  Administered 2023-02-20: 4 mg via INTRAVENOUS

## 2023-02-20 MED ORDER — PROPOFOL 10 MG/ML IV BOLUS
INTRAVENOUS | Status: DC | PRN
  Administered 2023-02-20: 160 mg via INTRAVENOUS

## 2023-02-20 MED ORDER — PHENYLEPHRINE 80 MCG/ML (10ML) SYRINGE FOR IV PUSH (FOR BLOOD PRESSURE SUPPORT)
PREFILLED_SYRINGE | INTRAVENOUS | Status: AC
  Filled 2023-02-20: qty 10

## 2023-02-20 MED ORDER — FENTANYL CITRATE (PF) 100 MCG/2ML IJ SOLN: 25.0000 ug | INTRAMUSCULAR | Status: DC | PRN

## 2023-02-20 MED ORDER — ACETAMINOPHEN 500 MG PO TABS
ORAL_TABLET | ORAL | Status: AC
  Filled 2023-02-20: qty 2

## 2023-02-20 MED ORDER — ONDANSETRON HCL 4 MG/2ML IJ SOLN
4.0000 mg | Freq: Once | INTRAMUSCULAR | Status: AC
  Administered 2023-02-20: 4 mg via INTRAVENOUS

## 2023-02-20 MED ORDER — ONDANSETRON HCL 4 MG/2ML IJ SOLN
INTRAMUSCULAR | Status: AC
  Filled 2023-02-20: qty 2

## 2023-02-20 MED ORDER — CHLORHEXIDINE GLUCONATE 0.12 % MT SOLN
15.0000 mL | Freq: Once | OROMUCOSAL | Status: AC
  Administered 2023-02-20: 15 mL via OROMUCOSAL

## 2023-02-20 MED ORDER — ORAL CARE MOUTH RINSE: 15.0000 mL | Freq: Once | OROMUCOSAL | Status: AC

## 2023-02-20 MED ORDER — LIDOCAINE-EPINEPHRINE 1 %-1:100000 IJ SOLN
INTRAMUSCULAR | Status: DC | PRN
  Administered 2023-02-20: 10 mL

## 2023-02-20 MED ORDER — MIDAZOLAM HCL 2 MG/2ML IJ SOLN
INTRAMUSCULAR | Status: DC | PRN
  Administered 2023-02-20: 2 mg via INTRAVENOUS

## 2023-02-20 MED ORDER — CELECOXIB 200 MG PO CAPS
ORAL_CAPSULE | ORAL | Status: AC
  Filled 2023-02-20: qty 2

## 2023-02-20 MED ORDER — CHLORHEXIDINE GLUCONATE 0.12 % MT SOLN
OROMUCOSAL | Status: AC
  Filled 2023-02-20: qty 15

## 2023-02-20 MED ORDER — EPHEDRINE 5 MG/ML INJ
INTRAVENOUS | Status: AC
  Filled 2023-02-20: qty 5

## 2023-02-20 MED ORDER — MIDAZOLAM HCL 2 MG/2ML IJ SOLN
INTRAMUSCULAR | Status: AC
  Filled 2023-02-20: qty 2

## 2023-02-20 MED ORDER — DEXAMETHASONE SODIUM PHOSPHATE 10 MG/ML IJ SOLN
INTRAMUSCULAR | Status: AC
  Filled 2023-02-20: qty 1

## 2023-02-20 MED ORDER — EPHEDRINE SULFATE-NACL 50-0.9 MG/10ML-% IV SOSY
PREFILLED_SYRINGE | INTRAVENOUS | Status: DC | PRN
  Administered 2023-02-20 (×3): 5 mg via INTRAVENOUS

## 2023-02-20 MED ORDER — GABAPENTIN 300 MG PO CAPS
300.0000 mg | ORAL_CAPSULE | ORAL | Status: AC
  Administered 2023-02-20: 300 mg via ORAL

## 2023-02-20 MED ORDER — LIDOCAINE-EPINEPHRINE 1 %-1:100000 IJ SOLN
INTRAMUSCULAR | Status: AC
  Filled 2023-02-20: qty 1

## 2023-02-20 MED ORDER — DEXAMETHASONE SODIUM PHOSPHATE 10 MG/ML IJ SOLN
INTRAMUSCULAR | Status: DC | PRN
  Administered 2023-02-20: 10 mg via INTRAVENOUS

## 2023-02-20 MED ORDER — PROPOFOL 10 MG/ML IV BOLUS
INTRAVENOUS | Status: AC
  Filled 2023-02-20: qty 20

## 2023-02-20 MED ORDER — IBUPROFEN 800 MG PO TABS: 800.0000 mg | ORAL_TABLET | Freq: Three times a day (TID) | ORAL | 1 refills | Status: DC | PRN

## 2023-02-20 MED ORDER — CELECOXIB 200 MG PO CAPS
400.0000 mg | ORAL_CAPSULE | ORAL | Status: AC
  Administered 2023-02-20: 400 mg via ORAL

## 2023-02-20 MED ORDER — GABAPENTIN 300 MG PO CAPS
ORAL_CAPSULE | ORAL | Status: AC
  Filled 2023-02-20: qty 1

## 2023-02-20 MED ORDER — PHENYLEPHRINE 80 MCG/ML (10ML) SYRINGE FOR IV PUSH (FOR BLOOD PRESSURE SUPPORT)
PREFILLED_SYRINGE | INTRAVENOUS | Status: DC | PRN
  Administered 2023-02-20 (×6): 80 ug via INTRAVENOUS

## 2023-02-20 MED ORDER — LIDOCAINE HCL (CARDIAC) PF 100 MG/5ML IV SOSY
PREFILLED_SYRINGE | INTRAVENOUS | Status: DC | PRN
  Administered 2023-02-20: 80 mg via INTRAVENOUS

## 2023-02-20 MED ORDER — LACTATED RINGERS IV SOLN: INTRAVENOUS | Status: DC

## 2023-02-20 MED ORDER — ACETAMINOPHEN 500 MG PO TABS
1000.0000 mg | ORAL_TABLET | ORAL | Status: AC
  Administered 2023-02-20: 1000 mg via ORAL

## 2023-02-20 MED ORDER — FENTANYL CITRATE (PF) 100 MCG/2ML IJ SOLN
INTRAMUSCULAR | Status: AC
  Filled 2023-02-20: qty 2

## 2023-02-20 MED ORDER — FENTANYL CITRATE (PF) 100 MCG/2ML IJ SOLN
INTRAMUSCULAR | Status: DC | PRN
  Administered 2023-02-20 (×4): 25 ug via INTRAVENOUS

## 2023-02-20 MED ORDER — SODIUM CHLORIDE 0.9 % IR SOLN
Status: DC | PRN
Start: 2023-02-20 — End: 2023-02-20
  Administered 2023-02-20: 1080 mL

## 2023-02-20 MED ORDER — OXYCODONE HCL 5 MG PO TABS: 5.0000 mg | ORAL_TABLET | Freq: Once | ORAL | Status: DC | PRN

## 2023-02-20 MED ORDER — OXYCODONE HCL 5 MG/5ML PO SOLN
5.0000 mg | Freq: Once | ORAL | Status: DC | PRN
Start: 2023-02-20 — End: 2023-02-20

## 2023-02-20 MED ORDER — LIDOCAINE HCL (PF) 2 % IJ SOLN
INTRAMUSCULAR | Status: AC
  Filled 2023-02-20: qty 5

## 2023-02-20 MED ORDER — SODIUM CHLORIDE 0.9 % IV SOLN: INTRAVENOUS | Status: DC

## 2023-02-20 SURGICAL SUPPLY — 12 items
DEVICE MYOSURE REACH (MISCELLANEOUS) IMPLANT
DRSG TELFA 3X8 NADH STRL (GAUZE/BANDAGES/DRESSINGS) IMPLANT
GLOVE BIO SURGEON STRL SZ 6.5 (GLOVE) ×1 IMPLANT
GOWN STRL REUS W/ TWL LRG LVL3 (GOWN DISPOSABLE) ×2 IMPLANT
GOWN STRL REUS W/TWL LRG LVL3 (GOWN DISPOSABLE) ×2
IV NS IRRIG 3000ML ARTHROMATIC (IV SOLUTION) ×1 IMPLANT
KIT PROCEDURE FLUENT (KITS) IMPLANT
KIT TURNOVER CYSTO (KITS) ×1 IMPLANT
MANIFOLD NEPTUNE II (INSTRUMENTS) ×1 IMPLANT
PACK DNC HYST (MISCELLANEOUS) ×1 IMPLANT
PAD PREP OB/GYN DISP 24X41 (PERSONAL CARE ITEMS) ×1 IMPLANT
SEAL ROD LENS SCOPE MYOSURE (ABLATOR) ×1 IMPLANT

## 2023-02-20 NOTE — Op Note (Addendum)
Procedure(s): HYSTEROSCOPY D&C WITH POLYPECTOMY Procedure Note  Emma Richards female 62 y.o. 02/20/2023  Indications: The patient is a 62 y.o. G51P3003 female with postmenopausal bleeding, endometrial mass (suspected polyp)  Pre-operative Diagnosis: Postmenopausal bleeding, uterine mass (suspected polyp)  Post-operative Diagnosis: Same, with endocervical polyp. Also likely submucosal fibroid  Surgeon: Hildred Laser, MD  Assistants:  None.   Anesthesia: General endotracheal anesthesia  Findings: The uterus was sounded to 6 cm. Weakly proliferative endometrium.  Small fragmented polyp noted in endocervical canal.  Tubal ostia were not visualized bilaterally.  No intrauterine masses visualized, however during performance of curettage, there was a noted raised area along the posterior surface of the lower uterine segment with caused some resistance during the curettage, gritty firm texture likely consistent with fibroid.  No perforations noted.   Procedure Details: The patient was seen in the Holding Room. The risks, benefits, complications, treatment options, and expected outcomes were discussed with the patient.  The patient concurred with the proposed plan, giving informed consent.  The site of surgery properly noted/marked. The patient was taken to the Operating Room, identified as Emma Richards and the procedure verified as Procedure(s) (LRB): HYSTEROSCOPY D&C WITH POLYPECTOMY (N/A). A Time Out was held and the above information confirmed.  The patient was then placed under general anesthesia without difficulty.  She was then prepped and draped in the normal sterile fashion, and placed in the dorsal lithotomy position.  A time out was performed.  An exam under anesthesia was performed with the findings noted above.  Straight catheterization was performed. A sterile speculum was inserted into vagina. A single-tooth tenaculum was used to grasp the anterior lip of the  cervix. Cervical dilation was performed. A 5 mm hysteroscope was introduced into the uterus under direct visualization. The cavity was allowed to fill, and then the entire cavity was explored with the findings described above. Attempts were made to utilized the Myosure Reach device to perform the polypectomy however due to the findings of being endocervical and unable to maintain adequate fluid pressure due to fluid return from the cervical region, the decision was made to proceed with curettage for removal of the polyp. The hysteroscope was removed, and a sharp curette was then passed into the uterus and endometrial and endocervical samplings were collected for pathology.  The hysteroscope was then re-introduced for final survey, with the polyp no longer identified within the cervical canal.  The hysteroscope was removed from the patient's uterine cavity. Ten ml of 1% Xylocaine with epinephrine were injected circumferentially into the cervix for analgesia. The tenaculum was removed and excellent hemostasis was noted. The speculum was removed from the vagina.   All instrument and sponge counts were correct at the end of the procedure x 2.  The patient tolerated the procedure well.  She was awakened and taken to the PACU in stable condition.    Estimated Blood Loss:  10 ml      Drains: straight catheterization prior to procedure with 75 ml of clear urine         Total IV Fluids:  900  ml  Fluid Deficit: 160 ml  Specimens: Endometrial curettings, endocervical curettings with polyp         Implants: None         Complications:  None; patient tolerated the procedure well.         Disposition: PACU - hemodynamically stable.         Condition: stable   Hildred Laser, MD  [facility]

## 2023-02-20 NOTE — Discharge Instructions (Signed)

## 2023-02-20 NOTE — OR Nursing (Signed)
In and out urinary catheter performed at case start by surgeon

## 2023-02-20 NOTE — Transfer of Care (Signed)
Immediate Anesthesia Transfer of Care Note  Patient: Anahita Vanzanten  Procedure(s) Performed: HYSTEROSCOPY D&C WITH POLYPECTOMY (Vagina )  Patient Location: PACU  Anesthesia Type:General  Level of Consciousness: drowsy  Airway & Oxygen Therapy: Patient Spontanous Breathing and Patient connected to face mask oxygen  Post-op Assessment: Report given to RN and Post -op Vital signs reviewed and stable  Post vital signs: stable  Last Vitals:  Vitals Value Taken Time  BP 141/64 02/20/23 1100  Temp 36.6 C 02/20/23 1058  Pulse 108 02/20/23 1101  Resp 13 02/20/23 1101  SpO2 100 % 02/20/23 1101  Vitals shown include unfiled device data.  Last Pain:  Vitals:   02/20/23 1058  TempSrc:   PainSc: Asleep         Complications: No notable events documented.

## 2023-02-20 NOTE — H&P (Signed)
GYNECOLOGY PREOPERATIVE HISTORY AND PHYSICAL   Subjective:  Emma Richards is a 62 y.o. [phone] here for surgical management of postmenopausal bleeding and endometrial polyp.  She underwent a polypectomy in office during her annual exam in June of this year, however since that time, she has continued to have intermittent vaginal bleeding.  Denied any pelvic pain at that time.  Patient had a recent ultrasound noting a 4 cm mass in the lower uterine segment that is hypervascular (most likely a another polyp vs fibroid).  No significant preoperative concerns.  Proposed surgery: Hysteroscopy Dilation and Curettage, Polypectomy (vs Myomectomy)    Pertinent Gynecological History: Menses: post-menopausal Bleeding: post menopausal bleeding Contraception: post menopausal status Last mammogram: normal Date: 11/15/2022 Last pap: normal Date: 10/08/2020   Past Medical History:  Diagnosis Date   Endometrial mass    GERD (gastroesophageal reflux disease)    Hair loss    Hyperlipidemia    Obesity    PMB (postmenopausal bleeding)    Screening for colon cancer 06/2021   NEG cologuard, repeat after 3 yrs   Type 2 diabetes mellitus (HCC)    no medications   Urinary urgency     Past Surgical History:  Procedure Laterality Date   ABLATION     BREAST BIOPSY Left    benign   OTHER SURGICAL HISTORY     Bladder lifted, tummy tuck    OB History  Gravida Para Term Preterm AB Living  3 3 3     3   SAB IAB Ectopic Multiple Live Births          3    # Outcome Date GA Lbr Len/2nd Weight Sex Type Anes PTL Lv  3 Term           2 Term           1 Term             Family History  Problem Relation Age of Onset   Graves' disease Mother    Cancer Mother    Breast cancer Mother 100   Graves' disease Sister    Graves' disease Brother    Diabetes Maternal Grandfather     Social History   Socioeconomic History   Marital status: Married    Spouse name: Not on file   Number of  children: 4   Years of education: Not on file   Highest education level: Master's degree (e.g., MA, MS, MEng, MEd, MSW, MBA)  Occupational History   Not on file  Tobacco Use   Smoking status: Never   Smokeless tobacco: Never  Vaping Use   Vaping status: Never Used  Substance and Sexual Activity   Alcohol use: Yes    Comment: rare   Drug use: Never   Sexual activity: Yes    Birth control/protection: Surgical    Comment: Ablation  Other Topics Concern   Not on file  Social History Narrative   Not on file   Social Determinants of Health   Financial Resource Strain: Low Risk  (02/14/2023)   Overall Financial Resource Strain (CARDIA)    Difficulty of Paying Living Expenses: Not hard at all  Food Insecurity: No Food Insecurity (02/14/2023)   Hunger Vital Sign    Worried About Running Out of Food in the Last Year: Never true    Ran Out of Food in the Last Year: Never true  Transportation Needs: No Transportation Needs (02/14/2023)   PRAPARE - Transportation    Lack  of Transportation (Medical): No    Lack of Transportation (Non-Medical): No  Physical Activity: Insufficiently Active (02/14/2023)   Exercise Vital Sign    Days of Exercise per Week: 2 days    Minutes of Exercise per Session: 60 min  Stress: No Stress Concern Present (02/14/2023)   [employer] of Occupational Health - Occupational Stress Questionnaire    Feeling of Stress : Not at all  Social Connections: Socially Integrated (02/14/2023)   Social Connection and Isolation Panel [NHANES]    Frequency of Communication with Friends and Family: More than three times a week    Frequency of Social Gatherings with Friends and Family: Once a week    Attends Religious Services: 1 to 4 times per year    Active Member of [employer] or Organizations: Yes    Attends Banker Meetings: 1 to 4 times per year    Marital Status: Married  Recent Concern: Social Connections - Moderately Isolated (11/18/2022)   Social  Connection and Isolation Panel [NHANES]    Frequency of Communication with Friends and Family: More than three times a week    Frequency of Social Gatherings with Friends and Family: Once a week    Attends Religious Services: Never    Database administrator or Organizations: No    Attends Engineer, structural: Not on file    Marital Status: Married  Catering manager Violence: Not on file    No current facility-administered medications on file prior to encounter.   No current outpatient medications on file prior to encounter.   No Known Allergies   Review of Systems Constitutional: No recent fever/chills/sweats Respiratory: No recent cough/bronchitis Cardiovascular: No chest pain Gastrointestinal: No recent nausea/vomiting/diarrhea Genitourinary: No UTI symptoms Hematologic/lymphatic:No history of coagulopathy or recent blood thinner use    Objective:   Blood pressure (!) 164/94, pulse 96, temperature 98.9 F (37.2 C), temperature source Oral, resp. rate 16, height 5\' 2"  (1.575 m), weight 83.5 kg, SpO2 100%. CONSTITUTIONAL: Well-developed, well-nourished female in no acute distress.  HENT:  Normocephalic, atraumatic, External right and left ear normal. Oropharynx is clear and moist EYES: Conjunctivae and EOM are normal. Pupils are equal, round, and reactive to light. No scleral icterus.  NECK: Normal range of motion, supple, no masses SKIN: Skin is warm and dry. No rash noted. Not diaphoretic. No erythema. No pallor. NEUROLOGIC: Alert and oriented to person, place, and time. Normal reflexes, muscle tone coordination. No cranial nerve deficit noted. PSYCHIATRIC: Normal mood and affect. Normal behavior. Normal judgment and thought content. CARDIOVASCULAR: Normal heart rate noted, regular rhythm RESPIRATORY: Effort and breath sounds normal, no problems with respiration noted ABDOMEN: Soft, nontender, nondistended. PELVIC: Deferred MUSCULOSKELETAL: Normal range of motion.  No edema and no tenderness. 2+ distal pulses.    Labs: Results for orders placed or performed during the hospital encounter of 02/20/23 (from the past 336 hour(s))  Glucose, capillary   Collection Time: 02/20/23  8:40 AM  Result Value Ref Range   Glucose-Capillary 123 (H) 70 - 99 mg/dL  Results for orders placed or performed during the hospital encounter of 02/16/23 (from the past 336 hour(s))  CBC   Collection Time: 02/16/23  8:04 AM  Result Value Ref Range   WBC 4.3 4.0 - 10.5 K/uL   RBC 5.04 3.87 - 5.11 MIL/uL   Hemoglobin 14.3 12.0 - 15.0 g/dL   HCT 16.1 09.6 - 04.5 %   MCV 82.3 80.0 - 100.0 fL   MCH 28.4 26.0 -  34.0 pg   MCHC 34.5 30.0 - 36.0 g/dL   RDW 16.1 09.6 - 04.5 %   Platelets 279 150 - 400 K/uL   nRBC 0.0 0.0 - 0.2 %     Imaging Studies: US PELVIS TRANSVAGINAL NON-OB (TV ONLY) ULTRASOUND REPORT   Location: [facility] Date of Service: 12/12/2022        Indications:Abnormal Uterine Bleeding Findings:  The uterus is anteverted and measures 7.19 x 3.94 x 2.95cm. Echo texture is heterogenous with evidence of a focal mass Within the uterus there is a heterogenous mass with vascular flow in the  LUS/cervix measuring ; 4.08 x 2.69 x 3.45 cm   The Endometrium measures 1.7 mm.   Right Ovary not well seen Left Ovary not well seen Survey of the adnexa demonstrates no adnexal masses. There is no free fluid in the cul de sac.   Impression: 1. Large vascular heterogenous mass within LUS  (? Polyp)   Recommendations: 1.Clinical correlation with the patient's History and Physical Exam.   Waldo Laine, RT  The ultrasound images and findings were reviewed by me and I agree with  the above report.  Elonda Husky, M.D. 12/17/2022 2:24 PM    Assessment:    Postmenopausal bleeding Endometrial mass (polyp vs fibroid)   Plan:   Counseling: Procedure, risks, reasons, benefits and complications (including injury to bowel, bladder, major  blood vessel, ureter, bleeding, possibility of transfusion, infection, or fistula formation) reviewed in detail. Likelihood of success in alleviating the patient's condition was discussed. Routine postoperative instructions will be reviewed with the patient and her family in detail after surgery.  The patient concurred with the proposed plan, giving informed written consent for the surgery.   Preop testing reviewed. Patient has been NPO since midnight, will receive further care instructions post-operatively.       Hildred Laser, MD [facility]

## 2023-02-20 NOTE — Anesthesia Procedure Notes (Signed)
Procedure Name: LMA Insertion Date/Time: 02/20/2023 9:53 AM  Performed by: Rodney Booze, CRNAPre-anesthesia Checklist: Patient identified, Emergency Drugs available, Suction available, Patient being monitored and Timeout performed Patient Re-evaluated:Patient Re-evaluated prior to induction Oxygen Delivery Method: Circle system utilized Preoxygenation: Pre-oxygenation with 100% oxygen Induction Type: IV induction Ventilation: Mask ventilation without difficulty LMA: LMA inserted LMA Size: 4.0 Number of attempts: 1 Placement Confirmation: positive ETCO2 and breath sounds checked- equal and bilateral Tube secured with: Tape Dental Injury: Teeth and Oropharynx as per pre-operative assessment

## 2023-02-20 NOTE — Anesthesia Postprocedure Evaluation (Signed)
Anesthesia Post Note  Patient: Emma Richards  Procedure(s) Performed: HYSTEROSCOPY D&C WITH POLYPECTOMY (Vagina )  Patient location during evaluation: PACU Anesthesia Type: General Level of consciousness: awake and alert Pain management: pain level controlled Vital Signs Assessment: post-procedure vital signs reviewed and stable Respiratory status: spontaneous breathing, nonlabored ventilation, respiratory function stable and patient connected to nasal cannula oxygen Cardiovascular status: blood pressure returned to baseline and stable Postop Assessment: no apparent nausea or vomiting Anesthetic complications: no  No notable events documented.   Last Vitals:  Vitals:   02/20/23 1145 02/20/23 1152  BP: (!) 143/79 (!) 153/86  Pulse: 86 93  Resp: 12 18  Temp: 36.7 C (!) 36.1 C  SpO2: 100% 99%    Last Pain:  Vitals:   02/20/23 1152  TempSrc: Temporal  PainSc: 0-No pain                 Stephanie Coup

## 2023-02-20 NOTE — Anesthesia Preprocedure Evaluation (Signed)
Anesthesia Evaluation  Patient identified by MRN, date of birth, ID band Patient awake    Reviewed: Allergy & Precautions, NPO status , Patient's Chart, lab work & pertinent test results  Airway Mallampati: III  TM Distance: >3 FB Neck ROM: full    Dental  (+) Chipped, Dental Advidsory Given   Pulmonary neg pulmonary ROS   Pulmonary exam normal        Cardiovascular negative cardio ROS Normal cardiovascular exam     Neuro/Psych negative neurological ROS  negative psych ROS   GI/Hepatic negative GI ROS, Neg liver ROS,,,  Endo/Other  negative endocrine ROSdiabetes    Renal/GU      Musculoskeletal   Abdominal   Peds  Hematology negative hematology ROS (+)   Anesthesia Other Findings Past Medical History: No date: Endometrial mass No date: GERD (gastroesophageal reflux disease) No date: Hair loss No date: Hyperlipidemia No date: Obesity No date: PMB (postmenopausal bleeding) 06/2021: Screening for colon cancer     Comment:  NEG cologuard, repeat after 3 yrs No date: Type 2 diabetes mellitus (HCC)     Comment:  no medications No date: Urinary urgency  Past Surgical History: No date: ABLATION No date: BREAST BIOPSY; Left     Comment:  benign No date: OTHER SURGICAL HISTORY     Comment:  Bladder lifted, tummy tuck  BMI    Body Mass Index: 33.65 kg/m      Reproductive/Obstetrics negative OB ROS                             Anesthesia Physical Anesthesia Plan  ASA: 2  Anesthesia Plan: General   Post-op Pain Management:    Induction: Intravenous  PONV Risk Score and Plan: 2 and Ondansetron, Dexamethasone and Midazolam  Airway Management Planned: LMA  Additional Equipment:   Intra-op Plan:   Post-operative Plan: Extubation in OR  Informed Consent: I have reviewed the patients History and Physical, chart, labs and discussed the procedure including the risks, benefits  and alternatives for the proposed anesthesia with the patient or authorized representative who has indicated his/her understanding and acceptance.     Dental Advisory Given  Plan Discussed with: Anesthesiologist, CRNA and Surgeon  Anesthesia Plan Comments: (Patient consented for risks of anesthesia including but not limited to:  - adverse reactions to medications - damage to eyes, teeth, lips or other oral mucosa - nerve damage due to positioning  - sore throat or hoarseness - Damage to heart, brain, nerves, lungs, other parts of body or loss of life  Patient voiced understanding and assent.)       Anesthesia Quick Evaluation

## 2023-02-21 ENCOUNTER — Encounter: Payer: Self-pay | Admitting: Obstetrics and Gynecology

## 2023-02-24 LAB — SURGICAL PATHOLOGY

## 2023-03-01 ENCOUNTER — Encounter: Payer: BC Managed Care – PPO | Admitting: Obstetrics and Gynecology

## 2023-03-06 NOTE — Progress Notes (Unsigned)
    OBSTETRICS/GYNECOLOGY POST-OPERATIVE CLINIC VISIT  Subjective:     Emma Richards is a 62 y.o. female who presents to the clinic 2 weeks status post HYSTEROSCOPY D&C WITH POLYPECTOMY  for  Endometrial Mass for PMB . Eating a regular diet without difficulty. Bowel movements are normal. The patient is not having any pain.  Notes bleeding stopped last week.   The following portions of the patient's history were reviewed and updated as appropriate: allergies, current medications, past family history, past medical history, past social history, past surgical history, and problem list.  Review of Systems Pertinent items are noted in HPI.   Objective:   BP 138/79   Pulse 78   Resp 16   Ht 5\' 2"  (1.575 m)   Wt 184 lb 3.2 oz (83.6 kg)   BMI 33.69 kg/m  Body mass index is 33.69 kg/m.  General:  alert and no distress  Abdomen: soft, bowel sounds active, non-tender  Incision:   healing well, no drainage, no erythema, no hernia, no seroma, no swelling, no dehiscence, incision well approximated    Pathology:   1. Endometrium, curettage,  :      LIMITED STRIPS OF INACTIVE ENDOMETRIUM.      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      MINUTE FRAGMENTS OF ENDOCERVICAL GLANDS AND CERVICAL SQUAMOUS EPITHELIUM.      NEGATIVE FOR GLANDULAR HYPERPLASIA, SQUAMOUS DYSPLASIA OR MALIGNANCY.       2. 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      MINUTE FRAGMENTS OF INACTIVE ENDOMETRIUM, ENDOCERVICAL GLANDS AND CERVICAL      SQUAMOUS EPITHELIUM.      NEGATIVE FOR GLANDULAR HYPERPLASIA, SQUAMOUS DYSPLASIA OR MALIGNANCY.   Assessment:   Patient s/p HYSTEROSCOPY D&C WITH POLYPECTOMY/MYOMECTOMY for PMB Doing well postoperatively.   Plan:   1. Continue any current medications as instructed by provider. 2. Wound care discussed. 3. Operative findings again reviewed. Pathology report discussed. 4. Activity restrictions:  none 5. Anticipated return to work:  has already returned to work . 6. Follow up: Return to clinic for any scheduled appointments or for any gynecologic concerns as needed.    Hildred Laser, MD [facility]

## 2023-03-07 ENCOUNTER — Ambulatory Visit (INDEPENDENT_AMBULATORY_CARE_PROVIDER_SITE_OTHER): Payer: BC Managed Care – PPO | Admitting: Obstetrics and Gynecology

## 2023-03-07 ENCOUNTER — Encounter: Payer: Self-pay | Admitting: Obstetrics and Gynecology

## 2023-03-07 VITALS — BP 138/79 | HR 78 | Resp 16 | Ht 62.0 in | Wt 184.2 lb

## 2023-03-07 DIAGNOSIS — Z9889 Other specified postprocedural states: Secondary | ICD-10-CM

## 2023-03-07 DIAGNOSIS — N9489 Other specified conditions associated with female genital organs and menstrual cycle: Secondary | ICD-10-CM

## 2023-03-07 DIAGNOSIS — Z4889 Encounter for other specified surgical aftercare: Secondary | ICD-10-CM

## 2023-03-07 DIAGNOSIS — N950 Postmenopausal bleeding: Secondary | ICD-10-CM

## 2023-03-21 ENCOUNTER — Encounter: Payer: Self-pay | Admitting: Nurse Practitioner

## 2023-03-21 ENCOUNTER — Other Ambulatory Visit: Payer: Self-pay | Admitting: Nurse Practitioner

## 2023-03-21 DIAGNOSIS — E1165 Type 2 diabetes mellitus with hyperglycemia: Secondary | ICD-10-CM

## 2023-03-21 MED ORDER — TIRZEPATIDE 5 MG/0.5ML ~~LOC~~ SOAJ: 5.0000 mg | SUBCUTANEOUS | 0 refills | Status: DC

## 2023-03-22 ENCOUNTER — Other Ambulatory Visit: Payer: Self-pay | Admitting: Nurse Practitioner

## 2023-03-22 DIAGNOSIS — E1165 Type 2 diabetes mellitus with hyperglycemia: Secondary | ICD-10-CM

## 2023-03-23 NOTE — Telephone Encounter (Signed)
Requested Prescriptions  Refused Prescriptions Disp Refills   MOUNJARO 2.5 MG/0.5ML Pen [Pharmacy Med Name: MOUNJARO 2.5 MG/0.5 ML PEN]      Sig: INJECT 2.5 MG SUBCUTANEOUSLY WEEKLY     Off-Protocol Failed - 03/22/2023  1:07 AM      Failed - Medication not assigned to a protocol, review manually.      Passed - Valid encounter within last 12 months    Recent Outpatient Visits           1 month ago Type 2 diabetes mellitus with hyperglycemia, without long-term current use of insulin [facility])   [place] [facility] Della Goo F, FNP   4 months ago Type 2 diabetes mellitus with hyperglycemia, without long-term current use of insulin [facility])   [facility] Della Goo F, FNP   1 year ago Type 2 diabetes mellitus with hyperglycemia, without long-term current use of insulin [facility])   [facility] Berniece Salines, [place]

## 2023-04-18 ENCOUNTER — Other Ambulatory Visit: Payer: Self-pay | Admitting: Nurse Practitioner

## 2023-04-18 DIAGNOSIS — E1165 Type 2 diabetes mellitus with hyperglycemia: Secondary | ICD-10-CM

## 2023-04-18 MED ORDER — TIRZEPATIDE 7.5 MG/0.5ML ~~LOC~~ SOAJ: 7.5000 mg | SUBCUTANEOUS | 0 refills | Status: DC

## 2023-04-19 ENCOUNTER — Telehealth: Payer: Self-pay | Admitting: Nurse Practitioner

## 2023-04-19 NOTE — Telephone Encounter (Signed)
Patient called f/u on her script for  tirzepatide [facility]) 7.5 MG/0.5ML Pen as she will be going out of town for 2 weeks on Saturday.

## 2023-04-20 ENCOUNTER — Telehealth: Payer: Self-pay

## 2023-04-20 NOTE — Telephone Encounter (Signed)
Spoke with pharmacy and confirmed everything was good to go with prescription, he stated they were just waiting for confirmation from patient that she wanted to pick it up as the co-pay is $100, confirmed with patient and pharmacy will fill it for her today.

## 2023-05-10 ENCOUNTER — Encounter: Payer: Self-pay | Admitting: Nurse Practitioner

## 2023-06-19 ENCOUNTER — Other Ambulatory Visit: Payer: Self-pay | Admitting: Nurse Practitioner

## 2023-06-19 ENCOUNTER — Encounter: Payer: Self-pay | Admitting: Nurse Practitioner

## 2023-06-19 DIAGNOSIS — E1165 Type 2 diabetes mellitus with hyperglycemia: Secondary | ICD-10-CM

## 2023-06-19 MED ORDER — TIRZEPATIDE 10 MG/0.5ML ~~LOC~~ SOAJ: 10.0000 mg | SUBCUTANEOUS | 0 refills | Status: DC

## 2023-07-31 ENCOUNTER — Encounter: Payer: Self-pay | Admitting: Nurse Practitioner

## 2023-08-01 ENCOUNTER — Other Ambulatory Visit: Payer: Self-pay | Admitting: Nurse Practitioner

## 2023-08-01 DIAGNOSIS — E1165 Type 2 diabetes mellitus with hyperglycemia: Secondary | ICD-10-CM

## 2023-08-01 MED ORDER — TIRZEPATIDE 10 MG/0.5ML ~~LOC~~ SOAJ: 10.0000 mg | SUBCUTANEOUS | 1 refills | Status: DC

## 2023-08-02 DIAGNOSIS — D225 Melanocytic nevi of trunk: Secondary | ICD-10-CM | POA: Diagnosis not present

## 2023-08-02 DIAGNOSIS — D2261 Melanocytic nevi of right upper limb, including shoulder: Secondary | ICD-10-CM | POA: Diagnosis not present

## 2023-08-02 DIAGNOSIS — D2272 Melanocytic nevi of left lower limb, including hip: Secondary | ICD-10-CM | POA: Diagnosis not present

## 2023-08-02 DIAGNOSIS — D2262 Melanocytic nevi of left upper limb, including shoulder: Secondary | ICD-10-CM | POA: Diagnosis not present

## 2023-09-04 ENCOUNTER — Ambulatory Visit: Admitting: Nurse Practitioner

## 2023-09-04 ENCOUNTER — Encounter: Payer: Self-pay | Admitting: Nurse Practitioner

## 2023-09-04 VITALS — BP 122/80 | Temp 98.0°F | Resp 18 | Ht 62.0 in | Wt 130.2 lb

## 2023-09-04 DIAGNOSIS — Z130 Encounter for screening for diseases of the blood and blood-forming organs and certain disorders involving the immune mechanism: Secondary | ICD-10-CM

## 2023-09-04 DIAGNOSIS — E1165 Type 2 diabetes mellitus with hyperglycemia: Secondary | ICD-10-CM | POA: Diagnosis not present

## 2023-09-04 DIAGNOSIS — E785 Hyperlipidemia, unspecified: Secondary | ICD-10-CM | POA: Diagnosis not present

## 2023-09-04 NOTE — Progress Notes (Signed)
 BP 122/80   Temp 98 F (36.7 C)   Resp 18   Ht 5\' 2"  (1.575 m)   Wt 130 lb 3.2 oz (59.1 kg)   BMI 23.81 kg/m    Subjective:    Patient ID: Emma Richards, female    DOB: 1960/12/03, 63 y.o.   MRN: [id number]  HPI: Emma Richards is a 63 y.o. female  Chief Complaint  Patient presents with   Medical Management of Chronic Issues    Discussed the use of AI scribe software for clinical note transcription with the patient, who gave verbal consent to proceed.  History of Present Illness Emma Ebbers "Nerissa Bannister" is a 64 year old female with type 2 diabetes, obesity, and hyperlipidemia who presents for a follow-up visit.  She feels well overall and notes that her blood pressure is normal. She is currently taking Mounjaro  10 mg weekly for her diabetes, with her last A1c recorded at 6.5. She is due for lab work including microalbumin, albumin, urine, CBC, CMP, lipid panel, and A1c.  She experiences regular bowel movements from Monday through Thursday after taking her medication, but has less activity on the weekends. She is working hard to ensure adequate protein intake and reports improvement in previous issues with heel and shin pain.  She has made lifestyle changes, including not buying sweets and focusing on proteins and vegetables, which has also led to her husband losing 20 pounds. She only drinks water now, having stopped consuming flavored waters.  She has lost weight, having bought three new wardrobes in the last six months, and is currently weighing 130 pounds. She is concerned about losing more weight and mentions that she feels her appetite returning on weekends, but maintains good self-control by weighing herself daily.  She reports sleeping better and has researched the benefits of her current treatment, noting improvements in her life beyond weight loss and A1c control.         02/16/2023    2:14 PM 11/21/2022    3:14 PM 12/13/2021    2:50 PM   Depression screen PHQ 2/9  Decreased Interest 0 0 0  Down, Depressed, Hopeless 0 0 0  PHQ - 2 Score 0 0 0    Relevant past medical, surgical, family and social history reviewed and updated as indicated. Interim medical history since our last visit reviewed. Allergies and medications reviewed and updated.  Review of Systems  Constitutional: Negative for fever or weight change.  Respiratory: Negative for cough and shortness of breath.   Cardiovascular: Negative for chest pain or palpitations.  Gastrointestinal: Negative for abdominal pain, no bowel changes.  Musculoskeletal: Negative for gait problem or joint swelling.  Skin: Negative for rash.  Neurological: Negative for dizziness or headache.  No other specific complaints in a complete review of systems (except as listed in HPI above).      Objective:    BP 122/80   Temp 98 F (36.7 C)   Resp 18   Ht 5\' 2"  (1.575 m)   Wt 130 lb 3.2 oz (59.1 kg)   BMI 23.81 kg/m    Wt Readings from Last 3 Encounters:  09/04/23 130 lb 3.2 oz (59.1 kg)  03/07/23 184 lb 3.2 oz (83.6 kg)  02/20/23 184 lb (83.5 kg)    Physical Exam Physical Exam MEASUREMENTS: Weight- 130. GENERAL: Alert, cooperative, well developed, no acute distress. HEENT: Normocephalic, normal oropharynx, moist mucous membranes. CHEST: Clear to auscultation bilaterally, no wheezes, rhonchi, or crackles. CARDIOVASCULAR: Normal  heart rate and rhythm, S1 and S2 normal without murmurs. ABDOMEN: Soft, non-tender, non-distended, without organomegaly, normal bowel sounds. EXTREMITIES: No cyanosis or edema. NEUROLOGICAL: Cranial nerves grossly intact, moves all extremities without gross motor or sensory deficit.   Results for orders placed or performed during the hospital encounter of 02/20/23  Surgical pathology   Collection Time: 02/20/23 12:00 AM  Result Value Ref Range   SURGICAL PATHOLOGY      SURGICAL PATHOLOGY [facility] [address], [address] [place], [place] [address] Telephone [phone] or [phone] Fax [phone]  REPORT OF SURGICAL PATHOLOGY   Accession #: [id number] Patient Name: Emma Richards, Emma Richards Visit # : [id number]  MRN: [id number] Physician: Teresa Fender DOB/Age 10-07-1960 (Age: 27) Gender: F Collected Date: 02/20/2023 Received Date: 02/20/2023  FINAL DIAGNOSIS       1. Endometrium, curettage,  :       LIMITED STRIPS OF INACTIVE ENDOMETRIUM.      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      MINUTE FRAGMENTS OF ENDOCERVICAL GLANDS AND CERVICAL SQUAMOUS EPITHELIUM.      NEGATIVE FOR GLANDULAR HYPERPLASIA, SQUAMOUS DYSPLASIA OR MALIGNANCY.       2. 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      MINUTE FRAGMENTS OF INACTIVE ENDOMETRIUM, ENDOCERVICAL GLANDS AND CERVICAL      SQUAMOUS EPITHELIUM.      NEGATIVE FOR GLANDULAR HYPERPLASIA, SQUAMO US  DYSPLASIA OR MALIGNANCY.       ELECTRONIC SIGNATURE : Dillard Frame Md, Pathologist, Electronic Signature  MICROSCOPIC DESCRIPTION  CASE COMMENTS STAINS USED IN DIAGNOSIS: H&E H&E    CLINICAL HISTORY  SPECIMEN(S) OBTAINED 1. Endometrium, curettage, 2. Endocervix, curettage, And Polyp  SPECIMEN COMMENTS: SPECIMEN CLINICAL INFORMATION: 1. Endometrial mass    Gross Description 1. "Endometrial curettings, received in formalin with a Telfa pad is a 3.0 x 2.5 x 0.3 cm aggregate of pink-tan tissue fragments and soft, hemorrhagic material.The specimen  is submitted in toto in 1 block (1A). 2. "Endocervical curettings, received in formalin with a telfa pad is a 1.5 x 0.8 x 0.3 cm aggregate of pink-tan, soft tissue and hemorrhagic material.The specimen is submitted in toto block (2A).      AMG 02/20/2023        Report signed out from the following location(s) [place] [address], [place] [address] CLIA #: [id number]   [facility] [address] [place], [place] [address] CLIA #: [id number]   Glucose, capillary   Collection Time: 02/20/23  8:40 AM  Result Value Ref Range   Glucose-Capillary 123 (H) 70 - 99 mg/dL  Glucose, capillary   Collection Time: 02/20/23 10:59 AM  Result Value Ref Range   Glucose-Capillary 107 (H) 70 - 99 mg/dL       Assessment & Plan:   Problem List Items Addressed This Visit       Endocrine   Type 2 diabetes mellitus with hyperglycemia, without long-term current use of insulin (HCC) - Primary   Relevant Orders   Comprehensive metabolic panel with GFR   Microalbumin / creatinine urine ratio   Hemoglobin A1c   Ambulatory referral to Ophthalmology     Other   Hyperlipidemia   Relevant Orders   Lipid panel   Other Visit Diagnoses       Screening for deficiency anemia       Relevant Orders  CBC with Differential/Platelet        Assessment and Plan Assessment & Plan Wellness Visit Routine wellness visit with improved symptoms and significant weight loss. Blood pressure is normal. No new concerns reported. - Order labs including microalbumin, albumin, urine, CBC, CMP, lipid panel, and A1c - Perform physical examination including heart and lung auscultation  Type 2 diabetes mellitus Type 2 diabetes mellitus, well-controlled with Mounjaro  10 mg weekly. Last A1c was 6.5 on 11/21/2022. Discussed potential to reduce Mounjaro  dosage to 7.5 mg weekly based on lab results. Reports improved symptoms and lifestyle changes, including increased protein intake and reduced consumption of sweets and flavored waters. - Check lab results to assess diabetes control - Consider reducing Mounjaro  dosage to 7.5 mg weekly based on lab results - Monitor A1c and adjust treatment as needed  Obesity Obesity, resolved with significant weight loss. Current weight is 130 pounds, no longer classified as obese. Reports improved symptoms and lifestyle changes including increased protein  intake and reduced consumption of sweets and flavored waters. Discussed the importance of maintaining a healthy diet and regular monitoring of weight. - Continue current lifestyle modifications including healthy diet and regular monitoring of weight  General Health Maintenance Routine health maintenance discussed, including eye exam and tetanus vaccination. She is considering a shingles vaccination and was advised to obtain it from the pharmacy to ensure insurance coverage. - Schedule eye exam - Verify tetanus vaccination status and update if necessary - Obtain shingles vaccination from pharmacy        Follow up plan: Return in about 6 months (around 03/06/2024) for follow up.

## 2023-09-05 ENCOUNTER — Other Ambulatory Visit: Payer: Self-pay | Admitting: Nurse Practitioner

## 2023-09-05 ENCOUNTER — Encounter: Payer: Self-pay | Admitting: Nurse Practitioner

## 2023-09-05 DIAGNOSIS — E1165 Type 2 diabetes mellitus with hyperglycemia: Secondary | ICD-10-CM

## 2023-09-05 LAB — CBC WITH DIFFERENTIAL/PLATELET
Absolute Lymphocytes: 1802 {cells}/uL (ref 850–3900)
Absolute Monocytes: 357 {cells}/uL (ref 200–950)
Basophils Absolute: 30 {cells}/uL (ref 0–200)
Basophils Relative: 0.7 %
Eosinophils Absolute: 60 {cells}/uL (ref 15–500)
Eosinophils Relative: 1.4 %
HCT: 45.1 % — ABNORMAL HIGH (ref 35.0–45.0)
Hemoglobin: 15.2 g/dL (ref 11.7–15.5)
MCH: 28.7 pg (ref 27.0–33.0)
MCHC: 33.7 g/dL (ref 32.0–36.0)
MCV: 85.1 fL (ref 80.0–100.0)
MPV: 11.9 fL (ref 7.5–12.5)
Monocytes Relative: 8.3 %
Neutro Abs: 2051 {cells}/uL (ref 1500–7800)
Neutrophils Relative %: 47.7 %
Platelets: 255 10*3/uL (ref 140–400)
RBC: 5.3 10*6/uL — ABNORMAL HIGH (ref 3.80–5.10)
RDW: 12.7 % (ref 11.0–15.0)
Total Lymphocyte: 41.9 %
WBC: 4.3 10*3/uL (ref 3.8–10.8)

## 2023-09-05 LAB — COMPREHENSIVE METABOLIC PANEL WITH GFR
AG Ratio: 1.5 (calc) (ref 1.0–2.5)
ALT: 21 U/L (ref 6–29)
AST: 18 U/L (ref 10–35)
Albumin: 4.2 g/dL (ref 3.6–5.1)
Alkaline phosphatase (APISO): 74 U/L (ref 37–153)
BUN: 17 mg/dL (ref 7–25)
CO2: 26 mmol/L (ref 20–32)
Calcium: 9.6 mg/dL (ref 8.6–10.4)
Chloride: 107 mmol/L (ref 98–110)
Creat: 0.78 mg/dL (ref 0.50–1.05)
Globulin: 2.8 g/dL (ref 1.9–3.7)
Glucose, Bld: 78 mg/dL (ref 65–99)
Potassium: 4.6 mmol/L (ref 3.5–5.3)
Sodium: 141 mmol/L (ref 135–146)
Total Bilirubin: 0.9 mg/dL (ref 0.2–1.2)
Total Protein: 7 g/dL (ref 6.1–8.1)
eGFR: 86 mL/min/{1.73_m2} (ref >=60)

## 2023-09-05 LAB — MICROALBUMIN / CREATININE URINE RATIO
Creatinine, Urine: 232 mg/dL (ref 20–275)
Microalb Creat Ratio: 6 mg/g{creat} (ref <30)
Microalb, Ur: 1.4 mg/dL

## 2023-09-05 LAB — LIPID PANEL
Cholesterol: 185 mg/dL (ref <200)
HDL: 56 mg/dL (ref >=50)
LDL Cholesterol (Calc): 114 mg/dL — ABNORMAL HIGH
Non-HDL Cholesterol (Calc): 129 mg/dL (ref <130)
Total CHOL/HDL Ratio: 3.3 (calc) (ref <5.0)
Triglycerides: 64 mg/dL (ref <150)

## 2023-09-05 LAB — HEMOGLOBIN A1C
Hgb A1c MFr Bld: 5.2 % (ref <5.7)
Mean Plasma Glucose: 103 mg/dL
eAG (mmol/L): 5.7 mmol/L

## 2023-09-05 MED ORDER — TIRZEPATIDE 7.5 MG/0.5ML ~~LOC~~ SOAJ: 7.5000 mg | SUBCUTANEOUS | 1 refills | Status: DC

## 2023-11-02 ENCOUNTER — Other Ambulatory Visit: Payer: Self-pay | Admitting: Nurse Practitioner

## 2023-11-02 DIAGNOSIS — E1165 Type 2 diabetes mellitus with hyperglycemia: Secondary | ICD-10-CM

## 2023-11-06 ENCOUNTER — Other Ambulatory Visit: Payer: Self-pay | Admitting: Nurse Practitioner

## 2023-11-06 ENCOUNTER — Encounter: Payer: Self-pay | Admitting: Nurse Practitioner

## 2023-11-06 DIAGNOSIS — E1165 Type 2 diabetes mellitus with hyperglycemia: Secondary | ICD-10-CM

## 2023-11-06 MED ORDER — TIRZEPATIDE 7.5 MG/0.5ML ~~LOC~~ SOAJ: 7.5000 mg | SUBCUTANEOUS | 1 refills | Status: AC

## 2023-11-06 NOTE — Telephone Encounter (Signed)
 Requested Prescriptions  Refused Prescriptions Disp Refills   MOUNJARO  2.5 MG/0.5ML Pen [Pharmacy Med Name: MOUNJARO  2.5 MG/0.5 ML PEN]      Sig: INJECT 2.5 MG SUBCUTANEOUSLY WEEKLY     Off-Protocol Failed - 11/06/2023  5:35 PM      Failed - Medication not assigned to a protocol, review manually.      Passed - Valid encounter within last 12 months    Recent Outpatient Visits           2 months ago Type 2 diabetes mellitus with hyperglycemia, without long-term current use of insulin [facility])   [facility] Gareth Mliss FALCON, FNP       Future Appointments             In 4 months Gareth, Mliss FALCON, FNP [facility]             MOUNJARO  5 MG/0.5ML Pen [Pharmacy Med Name: MOUNJARO  5 MG/0.5 ML PEN]      Sig: INJECT 5 MG SUBCUTANEOUSLY WEEKLY     Off-Protocol Failed - 11/06/2023  5:35 PM      Failed - Medication not assigned to a protocol, review manually.      Passed - Valid encounter within last 12 months    Recent Outpatient Visits           2 months ago Type 2 diabetes mellitus with hyperglycemia, without long-term current use of insulin [facility])   [facility] Gareth Mliss FALCON, FNP       Future Appointments             In 4 months Gareth, Mliss FALCON, FNP [facility], [facility]

## 2023-11-10 IMAGING — US US BREAST*L* LIMITED INC AXILLA
1 series · 2 of 2 positions shown · non-contrast
Comparison: Previous exam(s).

CLINICAL DATA: Patient for short-term follow-up of probably benign
left breast mass 6 o'clock position.

EXAM:
DIGITAL DIAGNOSTIC UNILATERAL LEFT MAMMOGRAM WITH TOMOSYNTHESIS AND
CAD; ULTRASOUND LEFT BREAST LIMITED
TECHNIQUE: Left digital diagnostic mammography and breast tomosynthesis was
performed. The images were evaluated with computer-aided detection.;
Targeted ultrasound examination of the left breast was performed.

[Series 1: us breast*left* limited inc axilla · 0.05mm/px · 2 of 2 slices shown]
[im 1/2]
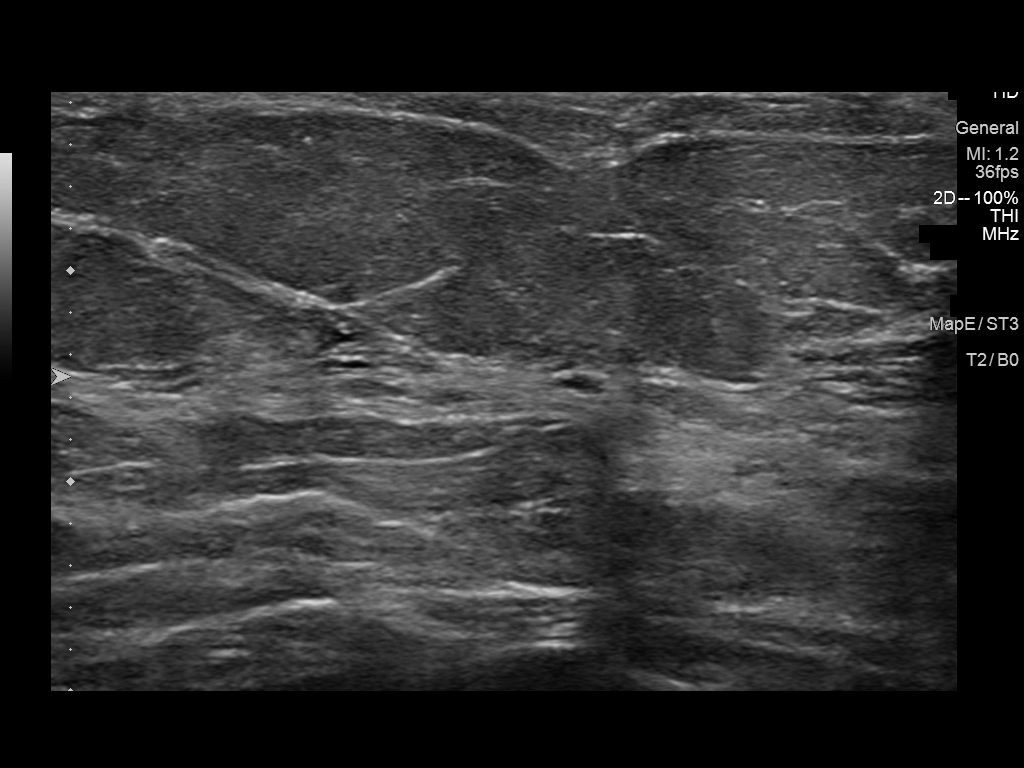
[im 2/2]
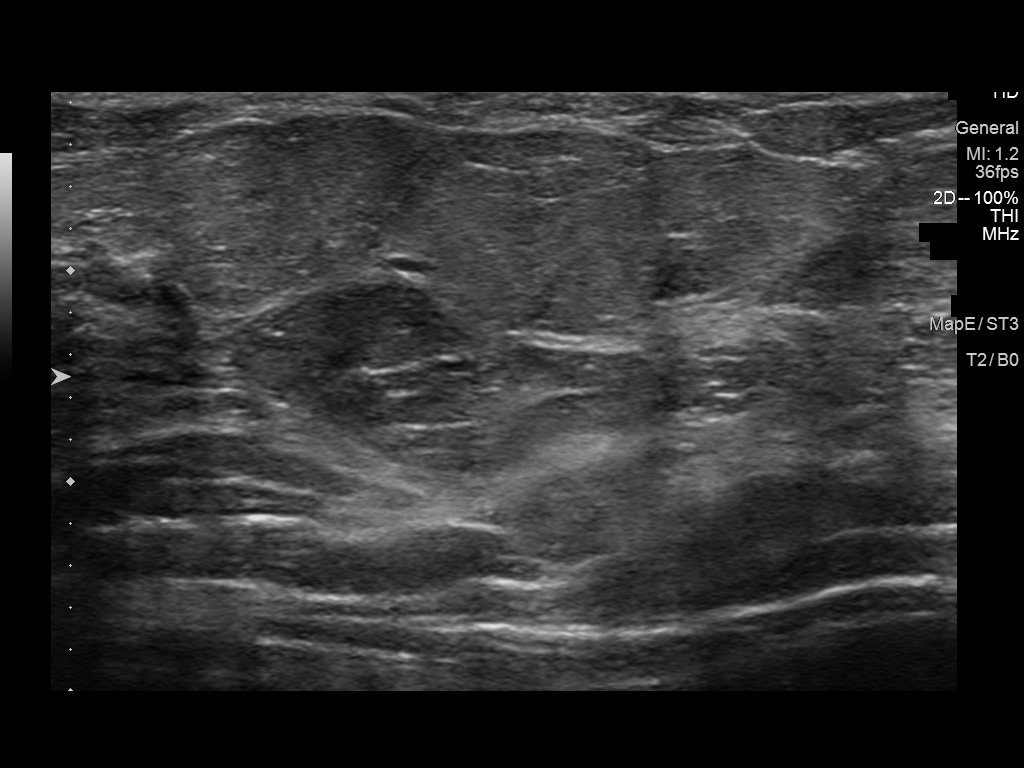

[2 of 2 positions shown; findings below may reference images not displayed]

ACR Breast Density Category c: The breast tissue is heterogeneously
dense, which may obscure small masses.
FINDINGS: Unchanged biopsy clip within the outer left breast. Interval
decrease in size or near complete resolution of previously described
mass within the 6 o'clock position. No additional changes within the
left breast.

Targeted ultrasound is performed, showing interval resolution of
previously described mass within the left breast 6 o'clock position
4 cm from the nipple.
IMPRESSION: Interval resolution of previously described probably benign mass
left breast 6 o'clock position compatible with resolved cyst. No
suspicious findings on current exam.

RECOMMENDATION:
Return to annual screening mammography [DATE].

I have discussed the findings and recommendations with the patient.
If applicable, a reminder letter will be sent to the patient
regarding the next appointment.

BI-RADS CATEGORY  2: Benign.

## 2024-01-01 NOTE — Progress Notes (Unsigned)
 PCP: Gareth Mliss FALCON, FNP   No chief complaint on file.   HPI:      Emma Richards is a 63 y.o. No obstetric history on file. whose LMP was No LMP recorded. Patient has had an ablation., presents today for her annual examination.  Her menses are absent due to ablation and then menopause. No PMB. Has tolerable vasomotor sx. Had PMB 7/24 with subsequent D&C with polypectomy/myomectomy 10/24  Sex activity: single partner, contraception - post menopausal status. She does have vaginal dryness, improved with lubricants. No bleeding/pain.  Last Pap: 10/08/20 Results were no abnormalities/neg HPV DNA; hx of abn pap in distant past with normal repeat; no bx/tx. Hx of STDs: none  Last mammogram: 11/15/22  Results were normal, repeat in 12 months There is a FH of breast cancer in her mom, genetic testing not indicated. There is no FH of ovarian cancer. The patient does do self-breast exams.  Colonoscopy: never; NEG Cologuard 2/23, repeat in 3 yrs  Tobacco use: The patient denies current or previous tobacco use. Alcohol use: none No drug use Exercise: moderately active  She does get adequate calcium but not Vitamin D in her diet.  Labs with PCP   Past Medical History:  Diagnosis Date   Endometrial mass    GERD (gastroesophageal reflux disease)    Hair loss    Hyperlipidemia    Obesity    PMB (postmenopausal bleeding)    Screening for colon cancer 06/2021   NEG cologuard, repeat after 3 yrs   Type 2 diabetes mellitus (HCC)    no medications   Urinary urgency     Past Surgical History:  Procedure Laterality Date   ABLATION     BREAST BIOPSY Left    benign   DILATATION & CURETTAGE/HYSTEROSCOPY WITH MYOSURE N/A 02/20/2023   Procedure: HYSTEROSCOPY D&C WITH POLYPECTOMY;  Surgeon: Connell Davies, MD;  Location: [facility] ORS;  Service: Gynecology;  Laterality: N/A;   OTHER SURGICAL HISTORY     Bladder lifted, tummy tuck    Family History  Problem Relation Age of Onset    Graves' disease Mother    Cancer Mother    Breast cancer Mother 60   Graves' disease Sister    Graves' disease Brother    Diabetes Maternal Grandfather     Social History   Socioeconomic History   Marital status: Married    Spouse name: Not on file   Number of children: 4   Years of education: Not on file   Highest education level: Master's degree (e.g., MA, MS, MEng, MEd, MSW, MBA)  Occupational History   Not on file  Tobacco Use   Smoking status: Never   Smokeless tobacco: Never  Vaping Use   Vaping status: Never Used  Substance and Sexual Activity   Alcohol use: Yes    Comment: rare   Drug use: Never   Sexual activity: Yes    Birth control/protection: Surgical    Comment: Ablation  Other Topics Concern   Not on file  Social History Narrative   Not on file   Social Drivers of Health   Financial Resource Strain: Low Risk  (09/01/2023)   Overall Financial Resource Strain (CARDIA)    Difficulty of Paying Living Expenses: Not hard at all  Food Insecurity: No Food Insecurity (09/01/2023)   Hunger Vital Sign    Worried About Running Out of Food in the Last Year: Never true    Ran Out of Food in the Last  Year: Never true  Transportation Needs: No Transportation Needs (09/01/2023)   PRAPARE - Administrator, Civil Service (Medical): No    Lack of Transportation (Non-Medical): No  Physical Activity: Inactive (09/01/2023)   Exercise Vital Sign    Days of Exercise per Week: 0 days    Minutes of Exercise per Session: 60 min  Stress: No Stress Concern Present (09/01/2023)   [employer] of Occupational Health - Occupational Stress Questionnaire    Feeling of Stress : Not at all  Social Connections: Socially Integrated (09/01/2023)   Social Connection and Isolation Panel    Frequency of Communication with Friends and Family: More than three times a week    Frequency of Social Gatherings with Friends and Family: Once a week    Attends Religious Services: 1 to 4 times  per year    Active Member of [employer] or Organizations: No    Attends Engineer, structural: 1 to 4 times per year    Marital Status: Married  Catering manager Violence: Not on file     Current Outpatient Medications:    minoxidil (LONITEN) 2.5 MG tablet, Take 0.5 tablets by mouth every morning., Disp: , Rfl:    tirzepatide  (MOUNJARO ) 7.5 MG/0.5ML Pen, Inject 7.5 mg into the skin once a week., Disp: 6 mL, Rfl: 1     ROS:  Review of Systems  Constitutional:  Negative for fatigue, fever and unexpected weight change.  Respiratory:  Negative for cough, shortness of breath and wheezing.   Cardiovascular:  Negative for chest pain, palpitations and leg swelling.  Gastrointestinal:  Negative for blood in stool, constipation, diarrhea, nausea and vomiting.  Endocrine: Negative for cold intolerance, heat intolerance and polyuria.  Genitourinary:  Negative for dyspareunia, dysuria, flank pain, frequency, genital sores, hematuria, menstrual problem, pelvic pain, urgency, vaginal bleeding, vaginal discharge and vaginal pain.  Musculoskeletal:  Negative for back pain, joint swelling and myalgias.  Skin:  Negative for rash.  Neurological:  Negative for dizziness, syncope, light-headedness, numbness and headaches.  Hematological:  Negative for adenopathy.  Psychiatric/Behavioral:  Negative for agitation, confusion, sleep disturbance and suicidal ideas. The patient is not nervous/anxious.    BREAST: No symptoms    Objective: There were no vitals taken for this visit.   Physical Exam Constitutional:      Appearance: She is well-developed.  Genitourinary:     Vulva normal.     Right Labia: No rash, tenderness or lesions.    Left Labia: No tenderness, lesions or rash.    No vaginal discharge, erythema or tenderness.      Right Adnexa: not tender and no mass present.    Left Adnexa: not tender and no mass present.    Cervical polyp present.     No cervical friability.         Uterus is not enlarged or tender.  Breasts:    Right: No mass, nipple discharge, skin change or tenderness.     Left: No mass, nipple discharge, skin change or tenderness.  Neck:     Thyroid : No thyromegaly.  Cardiovascular:     Rate and Rhythm: Normal rate and regular rhythm.     Heart sounds: Normal heart sounds. No murmur heard. Pulmonary:     Effort: Pulmonary effort is normal.     Breath sounds: Normal breath sounds.  Abdominal:     Palpations: Abdomen is soft.     Tenderness: There is no abdominal tenderness. There is no guarding or rebound.  Musculoskeletal:        General: Normal range of motion.     Cervical back: Normal range of motion.  Lymphadenopathy:     Cervical: No cervical adenopathy.  Neurological:     General: No focal deficit present.     Mental Status: She is alert and oriented to person, place, and time.     Cranial Nerves: No cranial nerve deficit.  Skin:    General: Skin is warm and dry.  Psychiatric:        Mood and Affect: Mood normal.        Behavior: Behavior normal.        Thought Content: Thought content normal.        Judgment: Judgment normal.  Vitals reviewed.    Cervix visualized and polyp noted.  Ring forcep applied to cervix and twisting motion removed polyp intact.  Hemostasis obtained.  Assessment/Plan: Encounter for annual routine gynecological examination  Encounter for screening mammogram for malignant neoplasm of breast - Plan: MM 3D SCREENING MAMMOGRAM BILATERAL BREAST; pt to schedule mammo  Endocervical polyp - Plan: Surgical pathology; removed today, sent to path.          GYN counsel breast self exam, mammography screening, menopause, adequate intake of calcium and vitamin D, diet and exercise    F/U  No follow-ups on file.  Lavaughn Bisig B. Nishanth Mccaughan, PA-C 01/01/2024 5:25 PM

## 2024-01-02 ENCOUNTER — Other Ambulatory Visit (HOSPITAL_COMMUNITY)
Admission: RE | Admit: 2024-01-02 | Discharge: 2024-01-02 | Disposition: A | Discharge status: Home / Self Care | Source: Ambulatory Visit | Attending: Obstetrics and Gynecology | Admitting: Obstetrics and Gynecology

## 2024-01-02 ENCOUNTER — Encounter: Payer: Self-pay | Admitting: Obstetrics and Gynecology

## 2024-01-02 ENCOUNTER — Ambulatory Visit (INDEPENDENT_AMBULATORY_CARE_PROVIDER_SITE_OTHER): Admitting: Obstetrics and Gynecology

## 2024-01-02 VITALS — BP 121/77 | HR 90 | Ht 62.0 in | Wt 123.0 lb

## 2024-01-02 DIAGNOSIS — N950 Postmenopausal bleeding: Secondary | ICD-10-CM

## 2024-01-02 DIAGNOSIS — Z124 Encounter for screening for malignant neoplasm of cervix: Secondary | ICD-10-CM | POA: Diagnosis not present

## 2024-01-02 DIAGNOSIS — Z1151 Encounter for screening for human papillomavirus (HPV): Secondary | ICD-10-CM | POA: Insufficient documentation

## 2024-01-02 DIAGNOSIS — Z1211 Encounter for screening for malignant neoplasm of colon: Secondary | ICD-10-CM

## 2024-01-02 DIAGNOSIS — Z01419 Encounter for gynecological examination (general) (routine) without abnormal findings: Secondary | ICD-10-CM

## 2024-01-02 DIAGNOSIS — Z1231 Encounter for screening mammogram for malignant neoplasm of breast: Secondary | ICD-10-CM

## 2024-01-02 NOTE — Patient Instructions (Addendum)
 I value your feedback and you entrusting [place] with your care. If you get a [facility] patient survey, I would appreciate you taking the time to let us know about your experience today. Thank you!  [facility] ([place])--[phone]

## 2024-01-03 LAB — CYTOLOGY - PAP
Comment: NEGATIVE
Diagnosis: NEGATIVE
High risk HPV: NEGATIVE

## 2024-01-04 ENCOUNTER — Encounter: Payer: Self-pay | Admitting: Nurse Practitioner

## 2024-01-16 ENCOUNTER — Ambulatory Visit
Admission: RE | Admit: 2024-01-16 | Discharge: 2024-01-16 | Disposition: A | Discharge status: Home / Self Care | Source: Ambulatory Visit | Attending: Obstetrics and Gynecology | Admitting: Obstetrics and Gynecology

## 2024-01-16 DIAGNOSIS — Z1231 Encounter for screening mammogram for malignant neoplasm of breast: Secondary | ICD-10-CM | POA: Diagnosis not present

## 2024-01-18 ENCOUNTER — Ambulatory Visit: Payer: Self-pay | Admitting: Obstetrics and Gynecology

## 2024-01-18 ENCOUNTER — Other Ambulatory Visit: Payer: Self-pay | Admitting: Obstetrics and Gynecology

## 2024-01-18 DIAGNOSIS — R928 Other abnormal and inconclusive findings on diagnostic imaging of breast: Secondary | ICD-10-CM

## 2024-01-24 ENCOUNTER — Ambulatory Visit
Admission: RE | Admit: 2024-01-24 | Discharge: 2024-01-24 | Disposition: A | Discharge status: Home / Self Care | Source: Ambulatory Visit | Attending: Obstetrics and Gynecology | Admitting: Obstetrics and Gynecology

## 2024-01-24 ENCOUNTER — Ambulatory Visit: Payer: Self-pay | Admitting: Obstetrics and Gynecology

## 2024-01-24 DIAGNOSIS — R928 Other abnormal and inconclusive findings on diagnostic imaging of breast: Secondary | ICD-10-CM | POA: Diagnosis not present

## 2024-01-24 DIAGNOSIS — R92333 Mammographic heterogeneous density, bilateral breasts: Secondary | ICD-10-CM | POA: Diagnosis not present

## 2024-02-07 ENCOUNTER — Encounter: Payer: Self-pay | Admitting: Obstetrics and Gynecology

## 2024-02-07 DIAGNOSIS — K644 Residual hemorrhoidal skin tags: Secondary | ICD-10-CM

## 2024-02-13 ENCOUNTER — Telehealth: Payer: Self-pay | Admitting: Pharmacy Technician

## 2024-02-13 ENCOUNTER — Other Ambulatory Visit (HOSPITAL_COMMUNITY): Payer: Self-pay

## 2024-02-13 NOTE — Telephone Encounter (Signed)
 Pharmacy Patient Advocate Encounter   Received notification from CoverMyMeds that prior authorization for Mounjaro  2.5MG /0.5ML auto-injectors is due for renewal.   Insurance verification completed.   The patient is insured through [employer].  Action: Medication is now available without a prior authorization.

## 2024-03-06 ENCOUNTER — Other Ambulatory Visit: Payer: Self-pay

## 2024-03-06 ENCOUNTER — Ambulatory Visit: Admitting: Nurse Practitioner

## 2024-03-06 VITALS — BP 120/72 | HR 102 | Temp 97.7°F | Resp 16 | Ht 62.0 in | Wt 116.7 lb

## 2024-03-06 DIAGNOSIS — Z7985 Long-term (current) use of injectable non-insulin antidiabetic drugs: Secondary | ICD-10-CM | POA: Diagnosis not present

## 2024-03-06 DIAGNOSIS — E119 Type 2 diabetes mellitus without complications: Secondary | ICD-10-CM

## 2024-03-06 DIAGNOSIS — E785 Hyperlipidemia, unspecified: Secondary | ICD-10-CM | POA: Diagnosis not present

## 2024-03-06 DIAGNOSIS — Z23 Encounter for immunization: Secondary | ICD-10-CM

## 2024-03-06 LAB — POCT GLYCOSYLATED HEMOGLOBIN (HGB A1C): Hemoglobin A1C: 5.1 % (ref 4.0–5.6)

## 2024-03-06 MED ORDER — ROSUVASTATIN CALCIUM 10 MG PO TABS: 10.0000 mg | ORAL_TABLET | Freq: Every day | ORAL | 0 refills | Status: DC

## 2024-03-06 NOTE — Progress Notes (Signed)
 BP 120/72 (Cuff Size: Normal)   Pulse (!) 102   Temp 97.7 F (36.5 C) (Oral)   Resp 16   Ht 5' 2 (1.575 m)   Wt 116 lb 11.2 oz (52.9 kg)   SpO2 99%   BMI 21.34 kg/m    Subjective:    Patient ID: Emma Richards, female    DOB: 29-Nov-1960, 63 y.o.   MRN: [id number]  HPI: Emma Richards is a 63 y.o. female  Chief Complaint  Patient presents with   Medical Management of Chronic Issues    6 month recheck   Discussed the use of AI scribe software for clinical note transcription with the patient, who gave verbal consent to proceed.  History of Present Illness [name] is a 63 year old female with type 2 diabetes, hyperlipidemia, and obesity who presents for a six-month follow-up.  Glycemic control and weight management - Type 2 diabetes mellitus with most recent hemoglobin A1c of 5.2%. - Currently taking Mounjaro  7.5 mg weekly. - Weight decreased to 116 pounds. - Obesity present. - No symptoms of hypoglycemia or hyperglycemia reported.  Hyperlipidemia - LDL cholesterol measured at 114 mg/dL. - Continues to have elevated cholesterol despite dietary modifications, including avoidance of fried foods and focus on chicken and vegetables. - Family history of hyperlipidemia in both parents and siblings, all of whom are on medication (specifics unknown). - Concerned about cardiovascular risk score of 8.5.  Colorectal and bowel symptoms - Appointment scheduled with colorectal specialist on December 2nd for evaluation of possible rectal prolapse or hemorrhoids due to sensation that something is not right. - History of prolapsed uterus. - Chronic bowel movement issues for several years. - Bowel movements have normalized since starting GLP-1 medication, with only two episodes of constipation in the past year, both managed with magnesium citrate. - Cologuard test not yet completed.  Gynecologic health - Up to date on Pap smear.  Alopecia and  minoxidil use - Ongoing hair loss for an extended period. - Currently using minoxidil for almost two years, with evidence of hair regrowth but continued significant hair shedding. - No adverse effects from minoxidil; no blood pressure issues and overall feeling well.    Lab Results  Component Value Date   HGBA1C 5.1 03/06/2024    Wt Readings from Last 3 Encounters:  03/06/24 116 lb 11.2 oz (52.9 kg)  01/02/24 123 lb (55.8 kg)  09/04/23 130 lb 3.2 oz (59.1 kg)   Body mass index is 21.34 kg/m.  Flowsheet Row Office Visit from 03/06/2024 in [facility]  1 26 inches     The 10-year ASCVD risk score (Arnett DK, et al., 2019) is: 8.5%   Values used to calculate the score:     Age: 44 years     Clincally relevant sex: Female     Is Non-Hispanic African American: No     Diabetic: Yes     Tobacco smoker: No     Systolic Blood Pressure: 120 mmHg     Is BP treated: Yes     HDL Cholesterol: 56 mg/dL     Total Cholesterol: 185 mg/dL      88/07/7972    1:98 AM 02/16/2023    2:14 PM 11/21/2022    3:14 PM  Depression screen PHQ 2/9  Decreased Interest 0 0 0  Down, Depressed, Hopeless 0 0 0  PHQ - 2 Score 0 0 0    Relevant past medical, surgical, family and  social history reviewed and updated as indicated. Interim medical history since our last visit reviewed. Allergies and medications reviewed and updated.  Review of Systems  Constitutional: Negative for fever or weight change.  Respiratory: Negative for cough and shortness of breath.   Cardiovascular: Negative for chest pain or palpitations.  Gastrointestinal: Negative for abdominal pain, no bowel changes.  Musculoskeletal: Negative for gait problem or joint swelling.  Skin: Negative for rash.  Neurological: Negative for dizziness or headache.  No other specific complaints in a complete review of systems (except as listed in HPI above).      Objective:      BP 120/72 (Cuff Size: Normal)   Pulse  (!) 102   Temp 97.7 F (36.5 C) (Oral)   Resp 16   Ht 5' 2 (1.575 m)   Wt 116 lb 11.2 oz (52.9 kg)   SpO2 99%   BMI 21.34 kg/m    Wt Readings from Last 3 Encounters:  03/06/24 116 lb 11.2 oz (52.9 kg)  01/02/24 123 lb (55.8 kg)  09/04/23 130 lb 3.2 oz (59.1 kg)    Physical Exam MEASUREMENTS: Weight- 116. GENERAL: Alert, cooperative, well developed, no acute distress. HEENT: Normocephalic, normal oropharynx, moist mucous membranes. CHEST: Clear to auscultation bilaterally, no wheezes, rhonchi, or crackles. CARDIOVASCULAR: Normal heart rate and rhythm, S1 and S2 normal without murmurs. ABDOMEN: Soft, non-tender, non-distended, without organomegaly, normal bowel sounds. EXTREMITIES: No cyanosis or edema. NEUROLOGICAL: Cranial nerves grossly intact, moves all extremities without gross motor or sensory deficit.  Results for orders placed or performed in visit on 03/06/24  POCT HgB A1C   Collection Time: 03/06/24  7:56 AM  Result Value Ref Range   Hemoglobin A1C 5.1 4.0 - 5.6 %   HbA1c POC (<> result, manual entry)     HbA1c, POC (prediabetic range)     HbA1c, POC (controlled diabetic range)            Assessment & Plan:   Problem List Items Addressed This Visit       Endocrine   Type 2 diabetes mellitus without complications (HCC) - Primary   Relevant Medications   rosuvastatin (CRESTOR) 10 MG tablet   Other Relevant Orders   HM Diabetes Foot Exam (Completed)   POCT HgB A1C (Completed)     Other   Hyperlipidemia   Relevant Medications   rosuvastatin (CRESTOR) 10 MG tablet     Assessment and Plan Assessment & Plan Type 2 diabetes mellitus Well-controlled with an A1c of 5.1. She is on Mounjaro  7.5 mg weekly, which she tolerates well. There is concern about insurance coverage for Mounjaro , but it is justified due to diabetes management. - Continue Mounjaro  7.5 mg weekly.  Hyperlipidemia LDL of 114. Her cardiovascular risk score is 8.5, indicating a need for  cholesterol medication due to diabetes and risk score. Discussed potential side effects of statins, including muscle pain and dark urine, and the possibility of trying different statins if side effects occur. - Started rosuvastatin at a low dose once daily. - Instructed to monitor for side effects such as muscle pain and dark urine. - Will reassess lipid levels and cardiovascular risk in six months.  Hx of Obesity Managed with Mounjaro , resulting in weight loss to 116 lbs. She reports improved bowel movements and reduced food cravings, contributing to weight management. - Continue current management with Mounjaro .    Has been noticing some changes in bowel habits, and is scheduled with GI in December.  Follow up plan: Return in about 6 months (around 09/03/2024) for follow up.

## 2024-04-02 DIAGNOSIS — Z1211 Encounter for screening for malignant neoplasm of colon: Secondary | ICD-10-CM | POA: Diagnosis not present

## 2024-04-02 DIAGNOSIS — M6289 Other specified disorders of muscle: Secondary | ICD-10-CM | POA: Diagnosis not present

## 2024-04-02 DIAGNOSIS — Z8719 Personal history of other diseases of the digestive system: Secondary | ICD-10-CM | POA: Diagnosis not present

## 2024-04-02 DIAGNOSIS — K644 Residual hemorrhoidal skin tags: Secondary | ICD-10-CM | POA: Diagnosis not present

## 2024-04-25 ENCOUNTER — Other Ambulatory Visit: Payer: Self-pay | Admitting: Nurse Practitioner

## 2024-04-25 DIAGNOSIS — E785 Hyperlipidemia, unspecified: Secondary | ICD-10-CM

## 2024-04-26 NOTE — Telephone Encounter (Signed)
 Requested Prescriptions  Pending Prescriptions Disp Refills   rosuvastatin  (CRESTOR ) 10 MG tablet [Pharmacy Med Name: ROSUVASTATIN  CALCIUM  10 MG TAB] 90 tablet 1    Sig: TAKE 1 TABLET BY MOUTH EVERY DAY     Cardiovascular:  Antilipid - Statins 2 Failed - 04/26/2024  6:34 PM      Failed - Lipid Panel in normal range within the last 12 months    Cholesterol, Total  Date Value Ref Range Status  10/09/2020 195 100 - 199 mg/dL Final   Cholesterol  Date Value Ref Range Status  09/04/2023 185 <200 mg/dL Final   LDL Cholesterol (Calc)  Date Value Ref Range Status  09/04/2023 114 (H) mg/dL (calc) Final    Comment:    Reference range: <100 . Desirable range <100 mg/dL for primary prevention;   <70 mg/dL for patients with CHD or diabetic patients  with > or = 2 CHD risk factors. SABRA LDL-C is now calculated using the Martin-Hopkins  calculation, which is a validated novel method providing  better accuracy than the Friedewald equation in the  estimation of LDL-C.  Gladis APPLETHWAITE et al. SANDREA. 7986;689(80): 2061-2068  (http://education.[facility].com/faq/FAQ164)    HDL  Date Value Ref Range Status  09/04/2023 56 > OR = 50 mg/dL Final  93/89/7977 48 >60 mg/dL Final   Triglycerides  Date Value Ref Range Status  09/04/2023 64 <150 mg/dL Final         Passed - Cr in normal range and within 360 days    Creat  Date Value Ref Range Status  09/04/2023 0.78 0.50 - 1.05 mg/dL Final   Creatinine, Urine  Date Value Ref Range Status  09/04/2023 232 20 - 275 mg/dL Final         Passed - Patient is not pregnant      Passed - Valid encounter within last 12 months    Recent Outpatient Visits           1 month ago Type 2 diabetes mellitus without complications, unspecified whether long term insulin use [facility])   [facility]areth Mliss [place], FNP   7 months ago Type 2 diabetes mellitus with hyperglycemia, without long-term current use of insulin [facility])   [facility] Gareth Mliss [place], [place]

## 2024-05-08 ENCOUNTER — Ambulatory Visit: Admitting: Nurse Practitioner

## 2024-05-08 ENCOUNTER — Encounter: Payer: Self-pay | Admitting: Nurse Practitioner

## 2024-05-08 VITALS — BP 142/92 | HR 91 | Temp 98.0°F | Ht 62.0 in | Wt 116.0 lb

## 2024-05-08 DIAGNOSIS — I10 Essential (primary) hypertension: Secondary | ICD-10-CM | POA: Insufficient documentation

## 2024-05-08 DIAGNOSIS — F4320 Adjustment disorder, unspecified: Secondary | ICD-10-CM | POA: Diagnosis not present

## 2024-05-08 NOTE — Progress Notes (Signed)
 "  BP (!) 142/92   Pulse 91   Temp 98 F (36.7 C)   Ht 5' 2 (1.575 m)   Wt 116 lb (52.6 kg)   SpO2 97%   BMI 21.22 kg/m    Subjective:    Patient ID: Emma Richards, female    DOB: 1961/04/06, 64 y.o.   MRN: [id number]  HPI: Emma Richards is a 64 y.o. female  Chief Complaint  Patient presents with   Hypertension   Discussed the use of AI scribe software for clinical note transcription with the patient, who gave verbal consent to proceed.  History of Present Illness Emma Richards is a 64 year old female who presents for a blood pressure check and to discuss personal stressors.  Elevated blood pressure - Previous blood pressure was 120/72 on March 06, 2024 - Blood pressure has been elevated recently - No antihypertensive medication use BP Readings from Last 3 Encounters:  05/08/24 (!) 142/92  03/06/24 120/72  01/02/24 121/77     Bereavement and emotional distress - Significant personal stress following the loss of both parents - Father passed away in 11/17/24 - Mother, with multiple myeloma, passed away in 2025/03/20 after a brief hospice stay - Involved in mother's end-of-life care, including administration of morphine for pain management - Feels overwhelmed and is having difficulty coping, especially during the holiday season - Symptoms include frequent crying, anger, and feeling 'not my best self' - 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 - Attending a grief support group at [employer] in [place], which is helpful - No current use of medication for emotional distress - Unable to secure an appointment with a psychologist or psychiatrist  Sleep and appetite disturbance - Difficulty with sleep - Decreased appetite, unable to eat properly - Concerned about the impact of poor nutrition on health  Occupational stress - Feels the need to take a couple of months off work to  recover from emotional distress         03/06/2024    8:01 AM 02/16/2023    2:14 PM 11/21/2022    3:14 PM  Depression screen PHQ 2/9  Decreased Interest 0 0 0  Down, Depressed, Hopeless 0 0 0  PHQ - 2 Score 0 0 0    Relevant past medical, surgical, family and social history reviewed and updated as indicated. Interim medical history since our last visit reviewed. Allergies and medications reviewed and updated.  Review of Systems  Constitutional: Negative for fever or weight change.  Respiratory: Negative for cough and shortness of breath.   Cardiovascular: Negative for chest pain or palpitations.  Gastrointestinal: Negative for abdominal pain, no bowel changes.  Musculoskeletal: Negative for gait problem or joint swelling.  Skin: Negative for rash.  Neurological: Negative for dizziness or headache.  No other specific complaints in a complete review of systems (except as listed in HPI above).      Objective:      BP (!) 142/92   Pulse 91   Temp 98 F (36.7 C)   Ht 5' 2 (1.575 m)   Wt 116 lb (52.6 kg)   SpO2 97%   BMI 21.22 kg/m    Wt Readings from Last 3 Encounters:  05/08/24 116 lb (52.6 kg)  03/06/24 116 lb 11.2 oz (52.9 kg)  01/02/24 123 lb (55.8 kg)    Physical Exam VITALS: BP- 158/110 GENERAL: Alert, cooperative, well developed, no  acute distress HEENT: Normocephalic, normal oropharynx, moist mucous membranes CHEST: Clear to auscultation bilaterally, No wheezes, rhonchi, or crackles CARDIOVASCULAR: Normal heart rate and rhythm, S1 and S2 normal without murmurs ABDOMEN: Soft, non-tender, non-distended, without organomegaly, Normal bowel sounds EXTREMITIES: No cyanosis or edema NEUROLOGICAL: Cranial nerves grossly intact, Moves all extremities without gross motor or sensory deficit  Results for orders placed or performed in visit on 03/06/24  POCT HgB A1C   Collection Time: 03/06/24  7:56 AM  Result Value Ref Range   Hemoglobin A1C 5.1 4.0 - 5.6 %    HbA1c POC (<> result, manual entry)     HbA1c, POC (prediabetic range)     HbA1c, POC (controlled diabetic range)            Assessment & Plan:   Problem List Items Addressed This Visit       Cardiovascular and Mediastinum   Primary hypertension - Primary     Assessment and Plan Assessment & Plan Grief reaction Experiencing significant emotional distress following the loss of her father in July and her mother in November. Reports difficulty coping with grief, leading to emotional outbursts, anger, and inability to perform well at work. Attending a grief support group, which has been beneficial. Does not feel the need for medication at this time but is considering taking time off work to manage her mental health. Acknowledges the difficulty of loss and the potential for grief to resurface in waves. - Provided a note for time off work to manage mental health. - Offered support for potential future medication if needed. -will fill out FMLA paperwork when sent by her HR representative ( 12 weeks)  Essential (primary) hypertension Blood pressure elevated at 158/110 mmHg during the visit, higher than previous reading of 120/72 mmHg in November 2025. Does not currently take antihypertensive medication. Reports that home blood pressure readings are not as high, attributing current elevation to stress and being late for the appointment. Acknowledges the impact of stress on blood pressure. - Monitor blood pressure at home. - Continue to monitor blood pressure readings and report any persistent high readings.        Follow up plan: Return if symptoms worsen or fail to improve. "

## 2024-05-21 ENCOUNTER — Encounter: Payer: Self-pay | Admitting: Nurse Practitioner

## 2024-09-04 ENCOUNTER — Ambulatory Visit: Admitting: Nurse Practitioner
